# Patient Record
Sex: Male | Born: 1940 | Race: White | Hispanic: No | Marital: Married | Smoking: Never smoker
Health system: Southern US, Community
[De-identification: ages and names within clinical notes are randomized; demographics above are authoritative.]

## PROBLEM LIST (undated history)

## (undated) DIAGNOSIS — F0280 Dementia in other diseases classified elsewhere without behavioral disturbance: Secondary | ICD-10-CM

## (undated) DIAGNOSIS — Z1389 Encounter for screening for other disorder: Secondary | ICD-10-CM

## (undated) DIAGNOSIS — I10 Essential (primary) hypertension: Secondary | ICD-10-CM

## (undated) DIAGNOSIS — Z1211 Encounter for screening for malignant neoplasm of colon: Secondary | ICD-10-CM

## (undated) DIAGNOSIS — Z86010 Personal history of colonic polyps: Secondary | ICD-10-CM

## (undated) DIAGNOSIS — G309 Alzheimer's disease, unspecified: Secondary | ICD-10-CM

## (undated) DIAGNOSIS — M1990 Unspecified osteoarthritis, unspecified site: Secondary | ICD-10-CM

## (undated) DIAGNOSIS — E119 Type 2 diabetes mellitus without complications: Secondary | ICD-10-CM

## (undated) HISTORY — DX: Encounter for screening for other disorder: Z13.89

## (undated) HISTORY — DX: Personal history of colonic polyps: Z86.010

## (undated) HISTORY — DX: Type 2 diabetes mellitus without complications: E11.9

## (undated) HISTORY — DX: Encounter for screening for malignant neoplasm of colon: Z12.11

## (undated) HISTORY — PX: COLONOSCOPY: SHX174

## (undated) HISTORY — DX: Unspecified osteoarthritis, unspecified site: M19.90

## (undated) HISTORY — DX: Essential (primary) hypertension: I10

---

## 1970-03-22 HISTORY — PX: TONSILLECTOMY: SHX5217

## 2005-01-07 ENCOUNTER — Ambulatory Visit: Payer: Self-pay | Admitting: Internal Medicine

## 2007-10-02 ENCOUNTER — Emergency Department: Payer: Self-pay | Admitting: Emergency Medicine

## 2007-10-02 ENCOUNTER — Other Ambulatory Visit: Payer: Self-pay

## 2008-10-24 ENCOUNTER — Ambulatory Visit: Payer: Self-pay | Admitting: Family Medicine

## 2010-01-06 ENCOUNTER — Ambulatory Visit: Payer: Self-pay | Admitting: Family Medicine

## 2010-03-22 DIAGNOSIS — Z86010 Personal history of colonic polyps: Secondary | ICD-10-CM

## 2010-03-22 HISTORY — DX: Personal history of colonic polyps: Z86.010

## 2011-01-29 ENCOUNTER — Ambulatory Visit: Payer: Self-pay | Admitting: General Surgery

## 2012-09-13 ENCOUNTER — Ambulatory Visit: Payer: Self-pay | Admitting: Family Medicine

## 2012-09-13 ENCOUNTER — Emergency Department: Payer: Self-pay | Admitting: Internal Medicine

## 2012-10-04 ENCOUNTER — Encounter: Payer: Self-pay | Admitting: *Deleted

## 2012-10-04 DIAGNOSIS — Z860100 Personal history of colon polyps, unspecified: Secondary | ICD-10-CM | POA: Insufficient documentation

## 2017-02-14 ENCOUNTER — Encounter: Payer: Self-pay | Admitting: Emergency Medicine

## 2017-02-14 ENCOUNTER — Emergency Department: Payer: Medicare HMO

## 2017-02-14 ENCOUNTER — Emergency Department
Admission: EM | Admit: 2017-02-14 | Discharge: 2017-02-14 | Disposition: A | Discharge status: Home / Self Care | Payer: Medicare HMO | Attending: Emergency Medicine | Admitting: Emergency Medicine

## 2017-02-14 DIAGNOSIS — R1032 Left lower quadrant pain: Secondary | ICD-10-CM | POA: Diagnosis not present

## 2017-02-14 DIAGNOSIS — N23 Unspecified renal colic: Secondary | ICD-10-CM | POA: Insufficient documentation

## 2017-02-14 DIAGNOSIS — E860 Dehydration: Secondary | ICD-10-CM | POA: Insufficient documentation

## 2017-02-14 DIAGNOSIS — E1165 Type 2 diabetes mellitus with hyperglycemia: Secondary | ICD-10-CM | POA: Insufficient documentation

## 2017-02-14 DIAGNOSIS — R109 Unspecified abdominal pain: Secondary | ICD-10-CM

## 2017-02-14 DIAGNOSIS — F0280 Dementia in other diseases classified elsewhere without behavioral disturbance: Secondary | ICD-10-CM | POA: Diagnosis not present

## 2017-02-14 DIAGNOSIS — G309 Alzheimer's disease, unspecified: Secondary | ICD-10-CM | POA: Diagnosis not present

## 2017-02-14 DIAGNOSIS — I10 Essential (primary) hypertension: Secondary | ICD-10-CM | POA: Insufficient documentation

## 2017-02-14 DIAGNOSIS — R1110 Vomiting, unspecified: Secondary | ICD-10-CM | POA: Diagnosis present

## 2017-02-14 DIAGNOSIS — R739 Hyperglycemia, unspecified: Secondary | ICD-10-CM

## 2017-02-14 HISTORY — DX: Alzheimer's disease, unspecified: G30.9

## 2017-02-14 HISTORY — DX: Dementia in other diseases classified elsewhere, unspecified severity, without behavioral disturbance, psychotic disturbance, mood disturbance, and anxiety: F02.80

## 2017-02-14 LAB — COMPREHENSIVE METABOLIC PANEL
ALT: 17 U/L (ref 17–63)
ANION GAP: 13 (ref 5–15)
AST: 24 U/L (ref 15–41)
Albumin: 4.1 g/dL (ref 3.5–5.0)
Alkaline Phosphatase: 55 U/L (ref 38–126)
BILIRUBIN TOTAL: 0.8 mg/dL (ref 0.3–1.2)
BUN: 22 mg/dL — AB (ref 6–20)
CO2: 25 mmol/L (ref 22–32)
Calcium: 9.1 mg/dL (ref 8.9–10.3)
Chloride: 98 mmol/L — ABNORMAL LOW (ref 101–111)
Creatinine, Ser: 1.04 mg/dL (ref 0.61–1.24)
Glucose, Bld: 261 mg/dL — ABNORMAL HIGH (ref 65–99)
POTASSIUM: 3.9 mmol/L (ref 3.5–5.1)
Sodium: 136 mmol/L (ref 135–145)
TOTAL PROTEIN: 7 g/dL (ref 6.5–8.1)

## 2017-02-14 LAB — URINALYSIS, COMPLETE (UACMP) WITH MICROSCOPIC
BACTERIA UA: NONE SEEN
BILIRUBIN URINE: NEGATIVE
Glucose, UA: >=500 mg/dL — AB
KETONES UR: 20 mg/dL — AB
LEUKOCYTES UA: NEGATIVE
NITRITE: NEGATIVE
PH: 6 (ref 5.0–8.0)
Protein, ur: 100 mg/dL — AB
Specific Gravity, Urine: 1.036 — ABNORMAL HIGH (ref 1.005–1.030)
WBC, UA: NONE SEEN WBC/hpf (ref 0–5)

## 2017-02-14 LAB — CBC
HEMATOCRIT: 39.4 % — AB (ref 40.0–52.0)
HEMOGLOBIN: 13 g/dL (ref 13.0–18.0)
MCH: 32.9 pg (ref 26.0–34.0)
MCHC: 33 g/dL (ref 32.0–36.0)
MCV: 99.4 fL (ref 80.0–100.0)
Platelets: 204 10*3/uL (ref 150–440)
RBC: 3.97 MIL/uL — AB (ref 4.40–5.90)
RDW: 13.7 % (ref 11.5–14.5)
WBC: 12.6 10*3/uL — AB (ref 3.8–10.6)

## 2017-02-14 LAB — LIPASE, BLOOD: LIPASE: 27 U/L (ref 11–51)

## 2017-02-14 MED ORDER — IOPAMIDOL (ISOVUE-300) INJECTION 61%
100.0000 mL | Freq: Once | INTRAVENOUS | Status: AC | PRN
  Administered 2017-02-14: 100 mL via INTRAVENOUS

## 2017-02-14 MED ORDER — ONDANSETRON HCL 4 MG/2ML IJ SOLN
4.0000 mg | Freq: Once | INTRAMUSCULAR | Status: AC
  Administered 2017-02-14: 4 mg via INTRAVENOUS
  Filled 2017-02-14: qty 2

## 2017-02-14 MED ORDER — ACETAMINOPHEN 325 MG PO TABS: 650.0000 mg | ORAL_TABLET | Freq: Four times a day (QID) | ORAL | 0 refills | Status: AC | PRN

## 2017-02-14 MED ORDER — TAMSULOSIN HCL 0.4 MG PO CAPS: 0.4000 mg | ORAL_CAPSULE | Freq: Every day | ORAL | 0 refills | Status: AC

## 2017-02-14 MED ORDER — ONDANSETRON 4 MG PO TBDP: 4.0000 mg | ORAL_TABLET | Freq: Three times a day (TID) | ORAL | 0 refills | Status: AC | PRN

## 2017-02-14 MED ORDER — SODIUM CHLORIDE 0.9 % IV BOLUS (SEPSIS)
1000.0000 mL | Freq: Once | INTRAVENOUS | Status: AC
  Administered 2017-02-14: 1000 mL via INTRAVENOUS

## 2017-02-14 NOTE — ED Provider Notes (Signed)
[facility] Emergency Department Provider Note  ____________________________________________  Time seen: Approximately 4:53 PM  I have reviewed the triage vital signs and the nursing notes.   HISTORY  Chief Complaint Emesis  Level 5 Caveat: Portions of the History and Physical are unable to be obtained due to patient being a poor historian   HPI David Mccormick is a 76 y.o. male with a history of dementia hypertension and diabetes who is brought to the ED by his family due to 2 episodes of vomiting this morning and sweating. Also reported some left flank pain. Family is worried that he might have her hernia on his left abdomen. Blood sugar is followed his usual baseline pattern recently although it has been more elevated over the past several weeks and had been in the past. He has a automated glucose monitor on the left arm. No falls or trauma. No fever. No constipation.     Past Medical History:  Diagnosis Date  . Alzheimer's dementia   . Arthritis   . Diabetes mellitus without complication (HCC)    since 2008  . Hypertension    since 2008  . Personal history of colonic polyps 2012  . Screening for obesity   . Special screening for malignant neoplasms, colon      Patient Active Problem List   Diagnosis Date Noted  . Personal history of colonic polyps      Past Surgical History:  Procedure Laterality Date  . COLONOSCOPY  1610,9604   20o6 with polyp sigmoid colon 60 cm/30cm colon snare. 2012 by Dr. Lemar Livings  . TONSILLECTOMY  1972     Prior to Admission medications   Medication Sig Start Date End Date Taking? Authorizing Provider  acetaminophen (TYLENOL) 325 MG tablet Take 2 tablets (650 mg total) by mouth every 6 (six) hours as needed. 02/14/17   Sharman Cheek, MD  ondansetron (ZOFRAN ODT) 4 MG disintegrating tablet Take 1 tablet (4 mg total) by mouth every 8 (eight) hours as needed for nausea or vomiting. 02/14/17   Sharman Cheek,  MD  tamsulosin (FLOMAX) 0.4 MG CAPS capsule Take 1 capsule (0.4 mg total) by mouth daily. 02/14/17   Sharman Cheek, MD  Lantus   Allergies Patient has no known allergies.   No family history on file.  Social History Social History   Tobacco Use  . Smoking status: Never Smoker  . Smokeless tobacco: Never Used  Substance Use Topics  . Alcohol use: No  . Drug use: No    Review of Systems  Constitutional:   No fever or chills.  ENT:   No sore throat. Positive rhinorrhea. Cardiovascular:   No chest pain or syncope. Respiratory:   No dyspnea or cough. Gastrointestinal:   Positive as above for abdominal pain and vomiting..  Musculoskeletal:   Negative for focal pain or swelling All other systems reviewed and are negative except as documented above in ROS and HPI.  ____________________________________________   PHYSICAL EXAM:  VITAL SIGNS: ED Triage Vitals  Enc Vitals Group     BP 02/14/17 1432 (!) 156/58     Pulse Rate 02/14/17 1432 (!) 52     Resp 02/14/17 1432 14     Temp 02/14/17 1432 97.6 F (36.4 C)     Temp Source 02/14/17 1432 Oral     SpO2 02/14/17 1432 99 %     Weight 02/14/17 1433 203 lb (92.1 kg)     Height 02/14/17 1433 5\' 9"  (1.753 m)  Head Circumference --      Peak Flow --      Pain Score --      Pain Loc --      Pain Edu? --      Excl. in GC? --     Vital signs reviewed, nursing assessments reviewed.   Constitutional:   Alert and oriented to person and place. Not in distress. Eyes:   No scleral icterus.  EOMI. No nystagmus. No conjunctival pallor. PERRL. ENT   Head:   Normocephalic and atraumatic.   Nose:   No congestion/rhinnorhea.    Mouth/Throat:   Dry mucous membranes, no pharyngeal erythema. No peritonsillar mass.    Neck:   No meningismus. Full ROM. Hematological/Lymphatic/Immunilogical:   No cervical lymphadenopathy. Cardiovascular:   RRR. Symmetric bilateral radial and DP pulses.  No murmurs.  Respiratory:    Normal respiratory effort without tachypnea/retractions. Breath sounds are clear and equal bilaterally. No wheezes/rales/rhonchi. Gastrointestinal:   Soft with mild generalized tenderness, nonfocal. Non distended. There is no CVA tenderness.  No rebound, rigidity, or guarding. No palpable hernia mass Genitourinary:   deferred Musculoskeletal:   Normal range of motion in all extremities. No joint effusions.  No lower extremity tenderness.  No edema. Neurologic:   Restricted but normal speech and language.  Motor grossly intact. No gross focal neurologic deficits are appreciated.  Skin:    Skin is warm, dry and intact. No rash noted.  No petechiae, purpura, or bullae.  ____________________________________________    LABS (pertinent positives/negatives) (all labs ordered are listed, but only abnormal results are displayed) Labs Reviewed  COMPREHENSIVE METABOLIC PANEL - Abnormal; Notable for the following components:      Result Value   Chloride 98 (*)    Glucose, Bld 261 (*)    BUN 22 (*)    All other components within normal limits  CBC - Abnormal; Notable for the following components:   WBC 12.6 (*)    RBC 3.97 (*)    HCT 39.4 (*)    All other components within normal limits  URINALYSIS, COMPLETE (UACMP) WITH MICROSCOPIC - Abnormal; Notable for the following components:   Color, Urine RED (*)    APPearance CLOUDY (*)    Specific Gravity, Urine 1.036 (*)    Glucose, UA >=500 (*)    Hgb urine dipstick LARGE (*)    Ketones, ur 20 (*)    Protein, ur 100 (*)    Squamous Epithelial / LPF 0-5 (*)    All other components within normal limits  LIPASE, BLOOD   ____________________________________________   EKG    ____________________________________________    RADIOLOGY  Ct Abdomen Pelvis W Contrast  Result Date: 02/14/2017 CLINICAL DATA:  Per nurse note due to patient condition: Pt comes into the Ed via POV c/o emesis, cold sweats, high sugar, and possible inguinal hernia  on the left. Patient states he feels fine now and per the family he is at his baseline. EXAM: CT ABDOMEN AND PELVIS WITH CONTRAST TECHNIQUE: Multidetector CT imaging of the abdomen and pelvis was performed using the standard protocol following bolus administration of intravenous contrast. CONTRAST:  100mL ISOVUE-300 IOPAMIDOL (ISOVUE-300) INJECTION 61% COMPARISON:  None. FINDINGS: Lower chest: There is atelectasis/ scarring at the lung bases. There is extensive atherosclerotic calcification of the coronary vessels. No pericardial effusion. Hepatobiliary: The liver is homogeneous. There is a calcified granuloma along the inferior aspect of the right hepatic lobe. No suspicious liver lesions are identified. Calcified gallstone or gallstones layer within  the gallbladder. Pancreas: Unremarkable. No pancreatic ductal dilatation or surrounding inflammatory changes. Spleen: Normal in size without focal abnormality. Adrenals/Urinary Tract: The adrenal glands are normal. There is hydronephrosis and perinephric stranding on the left side secondary to a calculus at the ureteropelvic junction which 3 mm. There is delayed enhancement and excretion on the left secondary to obstruction. The right kidney and ureter are unremarkable in appearance. The urinary bladder is unremarkable. Stomach/Bowel: Small hiatal hernia is present. The stomach and small bowel loops are normal in appearance. The appendix is well seen and has a normal appearance. Loops of colon are normal in appearance. Vascular/Lymphatic: There is atherosclerotic calcification of the abdominal aorta not associated with aneurysm. Although involved by atherosclerosis, there is vascular opacification of the celiac axis, superior mesenteric artery, and inferior mesenteric artery. Normal appearance of the portal venous system and inferior vena cava. No retroperitoneal or mesenteric adenopathy. Reproductive: Scattered prostatic calcifications are present. Prostate is mildly  enlarged. The seminal vesicles are normal in appearance. Other: Small amount of fat is identified within the inguinal rings bilaterally. No evidence for AS anterior abdominal wall hernia. Normal appearance of inguinal lymph nodes bilaterally. No evidence for inguinal mass as described in the history. Musculoskeletal: Convex right scoliosis of the lumbar spine, centered at L3-4 and associated with significant degenerative changes. IMPRESSION: 1. Obstructing left ureteral pelvic junction calculus measuring 3 mm. 2. No evidence for inguinal hernia or other inguinal mass. 3. Normal appendix. 4. Coronary artery disease. 5.  Aortic atherosclerosis.  (ICD10-I70.0) 6. Cholelithiasis. 7. Small hiatal hernia. 8. Prostatic enlargement. 9. Scoliosis and degenerative changes of the lumbar spine. Electronically Signed   By: Norva PavlovElizabeth  Brown M.D.   On: 02/14/2017 16:41    ____________________________________________   PROCEDURES Procedures  ____________________________________________   DIFFERENTIAL DIAGNOSIS  Bowel obstruction, perforation, diverticulitis, AAA, viral syndrome, kidney stone  CLINICAL IMPRESSION / ASSESSMENT AND PLAN / ED COURSE  Pertinent labs & imaging results that were available during my care of the patient were reviewed by me and considered in my medical decision making (see chart for details).     Clinical Course as of Feb 15 1920  [facility]  1543 Gen abp pain/tenderness. ?llq focality. Poor historian. Will get CT due to age, DM, lack of precise history  [PS]  1653 CT reveals obstructing 3mm stone on L UPJ. No appy or diverticulitis.  [PS]    Clinical Course User Index [PS] Sharman CheekStafford, Tameah Mihalko, MD     ----------------------------------------- 7:21 PM on 02/14/2017 -----------------------------------------  Symptoms controlled, vitals stable. Tolerating oral intake. Eager to go home. Counseled on kidney stone and dehydration. Zofran Tylenol and Flomax. Follow up with  primary care. Return precautions given.  ____________________________________________   FINAL CLINICAL IMPRESSION(S) / ED DIAGNOSES    Final diagnoses:  Ureteral colic  Left flank pain  Dehydration  Hyperglycemia      This SmartLink is deprecated. Use AVSMEDLIST instead to display the medication list for a patient.   Portions of this note were generated with dragon dictation software. Dictation errors may occur despite best attempts at proofreading.    Sharman CheekStafford, Kona Lover, MD 02/14/17 1921

## 2017-02-14 NOTE — ED Triage Notes (Signed)
Pt comes into the Ed via POV c/o emesis, cold sweats, high sugar, and possible inguinal hernia on the left.  Patient states he feels fine now and per the family he is at his baseline.  Patient tried to get a hold of PCP but was unable to.  Patient concerned about the bulge that was seen in the left lower quadrant. Denies any chest pain, shortness of breath, or dizziness.

## 2017-02-14 NOTE — Discharge Instructions (Signed)
Your CT scan shows a small (3mm) kidney stone outside of the left kidney.  This appears to be the cause of your pain.  It may also be causing decreased appetite and nausea.  Take Zofran as needed, along with tylenol to control symptoms. Ensure you are drinking lots of water to stay hydrated and help pass the stone. Follow up with your doctor this week for continued monitoring of your symptoms. Return to the ER immediately if you have severe pain, vomiting, or fever.

## 2017-02-14 NOTE — ED Notes (Signed)
Pt d/c with his spouse. Spouse verbalized understanding of d/c instructions and prescriptions.

## 2017-03-07 ENCOUNTER — Encounter: Payer: Self-pay | Admitting: Emergency Medicine

## 2017-03-07 ENCOUNTER — Emergency Department
Admission: EM | Admit: 2017-03-07 | Discharge: 2017-03-08 | Disposition: A | Discharge status: Home / Self Care | Payer: Medicare HMO | Attending: Emergency Medicine | Admitting: Emergency Medicine

## 2017-03-07 ENCOUNTER — Emergency Department: Payer: Medicare HMO

## 2017-03-07 DIAGNOSIS — R531 Weakness: Secondary | ICD-10-CM | POA: Diagnosis not present

## 2017-03-07 DIAGNOSIS — I10 Essential (primary) hypertension: Secondary | ICD-10-CM | POA: Diagnosis not present

## 2017-03-07 DIAGNOSIS — E119 Type 2 diabetes mellitus without complications: Secondary | ICD-10-CM | POA: Insufficient documentation

## 2017-03-07 DIAGNOSIS — W19XXXA Unspecified fall, initial encounter: Secondary | ICD-10-CM | POA: Diagnosis not present

## 2017-03-07 DIAGNOSIS — G309 Alzheimer's disease, unspecified: Secondary | ICD-10-CM | POA: Diagnosis not present

## 2017-03-07 LAB — CBC
HCT: 38.7 % — ABNORMAL LOW (ref 40.0–52.0)
Hemoglobin: 12.9 g/dL — ABNORMAL LOW (ref 13.0–18.0)
MCH: 33.4 pg (ref 26.0–34.0)
MCHC: 33.3 g/dL (ref 32.0–36.0)
MCV: 100.5 fL — AB (ref 80.0–100.0)
PLATELETS: 207 10*3/uL (ref 150–440)
RBC: 3.85 MIL/uL — AB (ref 4.40–5.90)
RDW: 14 % (ref 11.5–14.5)
WBC: 9.8 10*3/uL (ref 3.8–10.6)

## 2017-03-07 LAB — COMPREHENSIVE METABOLIC PANEL
ALK PHOS: 57 U/L (ref 38–126)
ALT: 21 U/L (ref 17–63)
ANION GAP: 12 (ref 5–15)
AST: 46 U/L — ABNORMAL HIGH (ref 15–41)
Albumin: 3.9 g/dL (ref 3.5–5.0)
BUN: 26 mg/dL — ABNORMAL HIGH (ref 6–20)
CALCIUM: 9.1 mg/dL (ref 8.9–10.3)
CHLORIDE: 104 mmol/L (ref 101–111)
CO2: 22 mmol/L (ref 22–32)
CREATININE: 1.22 mg/dL (ref 0.61–1.24)
GFR, EST NON AFRICAN AMERICAN: 56 mL/min — AB (ref >60)
Glucose, Bld: 100 mg/dL — ABNORMAL HIGH (ref 65–99)
Potassium: 3.5 mmol/L (ref 3.5–5.1)
SODIUM: 138 mmol/L (ref 135–145)
Total Bilirubin: 0.6 mg/dL (ref 0.3–1.2)
Total Protein: 6.7 g/dL (ref 6.5–8.1)

## 2017-03-07 LAB — TROPONIN I

## 2017-03-07 MED ORDER — SODIUM CHLORIDE 0.9 % IV BOLUS (SEPSIS)
500.0000 mL | Freq: Once | INTRAVENOUS | Status: AC
  Administered 2017-03-07: 500 mL via INTRAVENOUS

## 2017-03-07 MED ORDER — LORAZEPAM 2 MG/ML IJ SOLN
INTRAMUSCULAR | Status: AC
  Filled 2017-03-07: qty 1

## 2017-03-07 MED ORDER — LORAZEPAM 2 MG/ML IJ SOLN
1.0000 mg | Freq: Once | INTRAMUSCULAR | Status: AC
  Administered 2017-03-07: 1 mg via INTRAVENOUS

## 2017-03-07 MED ORDER — LAMOTRIGINE 25 MG PO TABS
75.0000 mg | ORAL_TABLET | Freq: Once | ORAL | Status: AC
  Administered 2017-03-07: 75 mg via ORAL
  Filled 2017-03-07: qty 3

## 2017-03-07 NOTE — ED Notes (Signed)
Sherilyn CooterHenry RN and Onalee Huaavid ED Tech helped the Pt to the side of the bed in order to try to get a urin sample. Pt was not able to understand that he needs to give a urin sample do to his advanced dementia. Pt was placed back in his bed.

## 2017-03-07 NOTE — ED Triage Notes (Signed)
Pt arrived to the ED from home for multiple falls during the day. EMS reports that the Pt is a dementia Pt and that he has been falling all trough the day per family. Pt is AOx2 in no apparent distress with no obvious neurological deficiencies.

## 2017-03-07 NOTE — ED Notes (Signed)
Pt returned from CT °

## 2017-03-07 NOTE — ED Notes (Signed)
Pt went to CT

## 2017-03-07 NOTE — ED Provider Notes (Signed)
[facility] Emergency Department Provider Note ____________________________________________   First MD Initiated Contact with Patient 03/07/17 1956     (approximate)  I have reviewed the triage vital signs and the nursing notes.   HISTORY  Chief Complaint Fall  History of present illness severely limited due to dementia.  HPI David Mccormick is a 76 y.o. male who presents with multiple falls over the course today, unknown mechanism, and not associated with any specific symptoms or visible injuries.  Patient himself is unable to voice any complaint.  Past Medical History:  Diagnosis Date  . Alzheimer's dementia   . Arthritis   . Diabetes mellitus without complication (HCC)    since 2008  . Hypertension    since 2008  . Personal history of colonic polyps 2012  . Screening for obesity   . Special screening for malignant neoplasms, colon     Patient Active Problem List   Diagnosis Date Noted  . Personal history of colonic polyps     Past Surgical History:  Procedure Laterality Date  . COLONOSCOPY  1610,9604   20o6 with polyp sigmoid colon 60 cm/30cm colon snare. 2012 by Dr. Lemar Livings  . TONSILLECTOMY  1972    Prior to Admission medications   Medication Sig Start Date End Date Taking? Authorizing Provider  acetaminophen (TYLENOL) 325 MG tablet Take 2 tablets (650 mg total) by mouth every 6 (six) hours as needed. 02/14/17   Sharman Cheek, MD  ondansetron (ZOFRAN ODT) 4 MG disintegrating tablet Take 1 tablet (4 mg total) by mouth every 8 (eight) hours as needed for nausea or vomiting. 02/14/17   Sharman Cheek, MD  tamsulosin (FLOMAX) 0.4 MG CAPS capsule Take 1 capsule (0.4 mg total) by mouth daily. 02/14/17   Sharman Cheek, MD    Allergies Patient has no known allergies.  History reviewed. No pertinent family history.  Social History Social History   Tobacco Use  . Smoking status: Never Smoker  . Smokeless tobacco: Never  Used  Substance Use Topics  . Alcohol use: No  . Drug use: No    Review of Systems Level V caveat: Unable to obtain review of systems due to dementia    ____________________________________________   PHYSICAL EXAM:  VITAL SIGNS: ED Triage Vitals  Enc Vitals Group     BP 03/07/17 1936 (!) 119/57     Pulse Rate 03/07/17 1936 72     Resp 03/07/17 1936 14     Temp 03/07/17 1936 97.7 F (36.5 C)     Temp Source 03/07/17 1936 Oral     SpO2 03/07/17 1936 100 %     Weight 03/07/17 1937 171 lb 15.3 oz (78 kg)     Height 03/07/17 1937 5\' 9"  (1.753 m)     Head Circumference --      Peak Flow --      Pain Score 03/07/17 1936 0     Pain Loc --      Pain Edu? --      Excl. in GC? --     Constitutional: Alert, oriented x2. Eyes: Conjunctivae are normal.  EOMI.  PERRLA. Head: Atraumatic. Nose: No congestion/rhinnorhea. Mouth/Throat: Mucous membranes are slightly dry.   Neck: Normal range of motion.  Cervical spine nontender. Cardiovascular: Normal rate, regular rhythm. Grossly normal heart sounds.  Good peripheral circulation. Respiratory: Normal respiratory effort.  No retractions. Lungs CTAB. Gastrointestinal: Soft and nontender. No distention.  Genitourinary: No CVA tenderness. Musculoskeletal: No lower extremity edema.  Extremities  warm and well perfused.  Neurologic:  Normal speech and language.  Motor intact in all extremities.  No gross focal neurologic deficits are appreciated.  Skin:  Skin is warm and dry. No rash noted. Psychiatric: Speech and behavior are normal.  ____________________________________________   LABS (all labs ordered are listed, but only abnormal results are displayed)  Labs Reviewed  CBC - Abnormal; Notable for the following components:      Result Value   RBC 3.85 (*)    Hemoglobin 12.9 (*)    HCT 38.7 (*)    MCV 100.5 (*)    All other components within normal limits  COMPREHENSIVE METABOLIC PANEL - Abnormal; Notable for the following  components:   Glucose, Bld 100 (*)    BUN 26 (*)    AST 46 (*)    GFR calc non Af Amer 56 (*)    All other components within normal limits  TROPONIN I  URINALYSIS, COMPLETE (UACMP) WITH MICROSCOPIC   ____________________________________________  EKG  ED ECG REPORT I, Dionne BucySebastian Izaan Kingbird, the attending physician, personally viewed and interpreted this ECG.  Date: 03/07/2017 EKG Time: 1933 Rate: 72 Rhythm: Atrial fibrillation QRS Axis: normal Intervals: LBBB ST/T Wave abnormalities: normal Narrative Interpretation: no evidence of acute ischemia; no significant change when compared to EKG of 09/13/2012 (at that time patient had mildly increased QRS which is now into LBBB range but morphology is identical)  ____________________________________________  RADIOLOGY  CT head: No ICH or other acute injuries CT cspine: No fracture  ____________________________________________   PROCEDURES  Procedure(s) performed: No    Critical Care performed: No ____________________________________________   INITIAL IMPRESSION / ASSESSMENT AND PLAN / ED COURSE  Pertinent labs & imaging results that were available during my care of the patient were reviewed by me and considered in my medical decision making (see chart for details).  76 year old male with past medical history as noted above presents after apparent multiple falls throughout the day.  Per EMS, patient was hypotensive on scene however blood pressure returned to normal with fluid bolus.  Patient himself is demented and is unable to provide any history.  Past medical records reviewed in Epic.  Patient was seen in the ED on 1126 with vomiting and flank pain.  He was discharged with tamsulosin for a ureteral stone.  On exam, vital signs are normal, the patient is comfortable appearing, and there is no visible trauma.  He is at baseline mental status, and the neuro exam is nonfocal.  The source of the falls is unclear, however I have  a strong suspicion that it may be related to orthostatics from being on the tamsulosin.  Differential also includes dehydration, UTI, other metabolic cause, or less likely cardiac.  Will obtain CT head and C-spine to rule out occult trauma, obtain basic labs, cardiac enzymes, and UA, and reassess.  If patient remains stable and the workup is negative, then likely cause is orthostatics and we will discontinue the tamsulosin.    ----------------------------------------- 11:31 PM on 03/07/2017 -----------------------------------------  Patient had an episode of agitation/sundowning.  After 1 mg of Ativan, he is now calm.  Still pending urinalysis.  If the UA is negative, and vital signs remain stable then plan for discharge home with instruction to discontinue the tamsulosin.  If patient has recurrent hypotension, or signs of UTI, may require admission.  Patient signed out to the oncoming physician Dr. Zenda AlpersWebster.  ____________________________________________   FINAL CLINICAL IMPRESSION(S) / ED DIAGNOSES  Final diagnoses:  Generalized weakness  NEW MEDICATIONS STARTED DURING THIS VISIT:  This SmartLink is deprecated. Use AVSMEDLIST instead to display the medication list for a patient.   Note:  This document was prepared using Dragon voice recognition software and may include unintentional dictation errors.    Dionne BucySiadecki, Derris Millan, MD 03/07/17 [phone]

## 2017-03-07 NOTE — ED Notes (Signed)
Pt became agitated and the MD ordered medication. Medication was given and the Pt placed on the bed with the help of Raquel RN and Maralyn SagoSarah, ED Tech.

## 2017-03-07 NOTE — ED Notes (Signed)
Sherilyn CooterHenry RN called the lab to get the results of the troponin that was sent about 2hr ago.

## 2017-03-08 NOTE — ED Notes (Signed)
Pt's spouse states that she wants to take the Pt home without having a urine test done. Dr. Zenda AlpersWebster was notified and states that she will talk to the Pt to explain the risks.

## 2017-03-08 NOTE — ED Notes (Signed)
Dr. Zenda AlpersWebster talked to the Pt's family. Pt's family requested that the Pt went home via EMS since the Pt's spouse is unable to help the Pt into or out of the car.

## 2017-03-08 NOTE — Discharge Instructions (Signed)
Please follow up with your primary care physician.

## 2017-03-08 NOTE — ED Provider Notes (Signed)
-----------------------------------------   12:46 AM on 03/08/2017 -----------------------------------------   Blood pressure 128/71, pulse 68, temperature 97.7 F (36.5 C), temperature source Oral, resp. rate 16, height 5\' 9"  (1.753 m), weight 78 kg (171 lb 15.3 oz), SpO2 97 %.  Assuming care from Dr. Marisa SeverinSiadecki.  In short, David Mccormick is a 76 y.o. male with a chief complaint of Fall .  Refer to the original H&P for additional details.  The current plan of care is to follow up the results of the urinalysis.  After waiting some time in the patient being unable to provide a urinalysis the patient's wife states that she wants to just take him home.  The patient is very combative when we try to provide an in and out and we do not want to take urine forcefully.  She also states that the patient does not understand that he needs to urinate and we will not do it at this time.  The patient will be discharged to follow-up with his primary care physician.        Rebecka ApleyWebster, Hogle P, MD 03/08/17 [id number]

## 2017-03-18 ENCOUNTER — Emergency Department: Payer: Medicare HMO

## 2017-03-18 ENCOUNTER — Encounter: Payer: Self-pay | Admitting: Emergency Medicine

## 2017-03-18 ENCOUNTER — Emergency Department
Admission: EM | Admit: 2017-03-18 | Discharge: 2017-03-18 | Disposition: A | Discharge status: Home / Self Care | Payer: Medicare HMO | Attending: Emergency Medicine | Admitting: Emergency Medicine

## 2017-03-18 DIAGNOSIS — I10 Essential (primary) hypertension: Secondary | ICD-10-CM | POA: Insufficient documentation

## 2017-03-18 DIAGNOSIS — Z79899 Other long term (current) drug therapy: Secondary | ICD-10-CM | POA: Diagnosis not present

## 2017-03-18 DIAGNOSIS — F0390 Unspecified dementia without behavioral disturbance: Secondary | ICD-10-CM | POA: Insufficient documentation

## 2017-03-18 DIAGNOSIS — K5900 Constipation, unspecified: Secondary | ICD-10-CM | POA: Insufficient documentation

## 2017-03-18 DIAGNOSIS — R4182 Altered mental status, unspecified: Secondary | ICD-10-CM | POA: Diagnosis present

## 2017-03-18 DIAGNOSIS — E119 Type 2 diabetes mellitus without complications: Secondary | ICD-10-CM | POA: Insufficient documentation

## 2017-03-18 LAB — COMPREHENSIVE METABOLIC PANEL
ALT: 29 U/L (ref 17–63)
AST: 53 U/L — AB (ref 15–41)
Albumin: 4.4 g/dL (ref 3.5–5.0)
Alkaline Phosphatase: 64 U/L (ref 38–126)
Anion gap: 11 (ref 5–15)
BUN: 20 mg/dL (ref 6–20)
CHLORIDE: 101 mmol/L (ref 101–111)
CO2: 23 mmol/L (ref 22–32)
Calcium: 9 mg/dL (ref 8.9–10.3)
Creatinine, Ser: 1.16 mg/dL (ref 0.61–1.24)
GFR calc Af Amer: >60 mL/min (ref >60)
GFR, EST NON AFRICAN AMERICAN: 59 mL/min — AB (ref >60)
Glucose, Bld: 364 mg/dL — ABNORMAL HIGH (ref 65–99)
POTASSIUM: 3.9 mmol/L (ref 3.5–5.1)
SODIUM: 135 mmol/L (ref 135–145)
Total Bilirubin: 1.1 mg/dL (ref 0.3–1.2)
Total Protein: 7.1 g/dL (ref 6.5–8.1)

## 2017-03-18 LAB — DIFFERENTIAL
BASOS ABS: 0 10*3/uL (ref 0–0.1)
BASOS PCT: 1 %
EOS ABS: 0 10*3/uL (ref 0–0.7)
Eosinophils Relative: 0 %
Lymphocytes Relative: 14 %
Lymphs Abs: 1 10*3/uL (ref 1.0–3.6)
MONO ABS: 0.3 10*3/uL (ref 0.2–1.0)
MONOS PCT: 4 %
Neutro Abs: 5.8 10*3/uL (ref 1.4–6.5)
Neutrophils Relative %: 81 %

## 2017-03-18 LAB — AMMONIA: AMMONIA: 22 umol/L (ref 9–35)

## 2017-03-18 LAB — CBC
HEMATOCRIT: 40.6 % (ref 40.0–52.0)
Hemoglobin: 13.4 g/dL (ref 13.0–18.0)
MCH: 33 pg (ref 26.0–34.0)
MCHC: 33.1 g/dL (ref 32.0–36.0)
MCV: 99.9 fL (ref 80.0–100.0)
Platelets: 195 10*3/uL (ref 150–440)
RBC: 4.06 MIL/uL — AB (ref 4.40–5.90)
RDW: 14.2 % (ref 11.5–14.5)
WBC: 7.3 10*3/uL (ref 3.8–10.6)

## 2017-03-18 LAB — BRAIN NATRIURETIC PEPTIDE: B NATRIURETIC PEPTIDE 5: 68 pg/mL (ref 0.0–100.0)

## 2017-03-18 LAB — TROPONIN I

## 2017-03-18 MED ORDER — HALOPERIDOL LACTATE 5 MG/ML IJ SOLN
2.0000 mg | Freq: Once | INTRAMUSCULAR | Status: AC
  Administered 2017-03-18: 2 mg via INTRAVENOUS
  Filled 2017-03-18: qty 1

## 2017-03-18 MED ORDER — IOPAMIDOL (ISOVUE-300) INJECTION 61%: 30.0000 mL | Freq: Once | INTRAVENOUS | Status: DC | PRN

## 2017-03-18 MED ORDER — POLYETHYLENE GLYCOL 3350 17 GM/SCOOP PO POWD: 17.0000 g | Freq: Two times a day (BID) | ORAL | 0 refills | Status: AC

## 2017-03-18 NOTE — ED Notes (Signed)
Family notified of safety precautions, fall precautions, and use of urinal. Son expresses understanding.

## 2017-03-18 NOTE — ED Notes (Signed)
Glucose 266 per patient device

## 2017-03-18 NOTE — ED Provider Notes (Signed)
[facility] Emergency Department Provider Note   ____________________________________________   First MD Initiated Contact with Patient 03/18/17 1405     (approximate)  I have reviewed the triage vital signs and the nursing notes.   HISTORY  Chief Complaint Altered Mental Status  history Limited by altered mental status  HPI David Mccormick is a 76 y.o. male Who comes in with a history of constipation for 5 days. Family told EMS that he is confused beyond his baseline dementia . 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 His breath smells funny is not an acetone smell and it is not really fetor hepaticus but it is it's funny chemical smell    Past Medical History:  Diagnosis Date  . Alzheimer's dementia   . Arthritis   . Diabetes mellitus without complication (HCC)    since 2008  . Hypertension    since 2008  . Personal history of colonic polyps 2012  . Screening for obesity   . Special screening for malignant neoplasms, colon     Patient Active Problem List   Diagnosis Date Noted  . Personal history of colonic polyps     Past Surgical History:  Procedure Laterality Date  . COLONOSCOPY  4098,[id number],2012   20o6 with polyp sigmoid colon 60 cm/30cm colon snare. 2012 by Dr. Lemar LivingsByrnett  . TONSILLECTOMY  1972    Prior to Admission medications   Medication Sig Start Date End Date Taking? Authorizing Provider  acetaminophen (TYLENOL) 325 MG tablet Take 2 tablets (650 mg total) by mouth every 6 (six) hours as needed. 02/14/17   Sharman CheekStafford, Phillip, MD  ondansetron (ZOFRAN ODT) 4 MG disintegrating tablet Take 1 tablet (4 mg total) by mouth every 8 (eight) hours as needed for nausea or vomiting. 02/14/17   Sharman CheekStafford, Phillip, MD  tamsulosin (FLOMAX) 0.4 MG CAPS capsule Take 1 capsule (0.4 mg total) by mouth  daily. 02/14/17   Sharman CheekStafford, Phillip, MD    Allergies Patient has no known allergies.  History reviewed. No pertinent family history.  Social History Social History   Tobacco Use  . Smoking status: Never Smoker  . Smokeless tobacco: Never Used  Substance Use Topics  . Alcohol use: No  . Drug use: No    Review of Systems  Constitutional: No fever/chills Eyes: No visual changes. ENT: No sore throat. Cardiovascular: Denies chest pain. Respiratory: Denies shortness of breath. Gastrointestinal: No abdominal pain.  No nausea, no vomiting.  No diarrhea.  No constipation. Genitourinary: Negative for dysuria. Musculoskeletal: Negative for back pain. Skin: Negative for rash. Neurological: Negative for headaches, focal weakness  ____________________________________________   PHYSICAL EXAM:  VITAL SIGNS: ED Triage Vitals  Enc Vitals Group     BP      Pulse      Resp      Temp      Temp src      SpO2      Weight      Height      Head Circumference      Peak Flow      Pain Score      Pain Loc      Pain Edu?      Excl. in GC?   } Constitutional: Alert and oriented. Well appearing and in no acute distress. Eyes: Conjunctivae are normal. PERRL.  EOMI. Head: Atraumatic. Nose: No congestion/rhinnorhea. Mouth/Throat: Mucous membranes are moist.  Oropharynx non-erythematous. Neck: No stridor. Cardiovascular: Normal rate, regular rhythm. Grossly normal heart sounds.  Good peripheral circulation. Respiratory: Normal respiratory effort.  No retractions. Lungs CTAB. Gastrointestinal: Soft but as noted in history of present illness he flinches when I palpate his right upper quadrant and then moves my hand away. No distention. No abdominal bruits. No CVA tenderness. Musculoskeletal: No lower extremity tenderness 1+ bilateral edema.  No joint effusions. Neurologic:  Normal speech and language. No gross focal neurologic deficits are appreciated.  Skin:  Skin is warm, dry and intact.  No rash noted.   ____________________________________________   LABS (all labs ordered are listed, but only abnormal results are displayed)  Labs Reviewed  COMPREHENSIVE METABOLIC PANEL  CBC  TROPONIN I  DIFFERENTIAL  BRAIN NATRIURETIC PEPTIDE  URINALYSIS, COMPLETE (UACMP) WITH MICROSCOPIC  AMMONIA  CBG MONITORING, ED   ____________________________________________  EKG  EKG read and interpreted by me shows normal sinus rhythm rate of 95 left axis nonspecific interventricular conduction delay he has decreased R-wave progression compared to EKG from December 17 but otherwise EKG looks similar allowing for changes in the baseline which is very wavy on the present EKG____________________________________________  RADIOLOGY  No results found.  ____________________________________________   PROCEDURES  Procedure(s) performed:   Procedures  Critical Care performed:   ____________________________________________   INITIAL IMPRESSION / ASSESSMENT AND PLAN / ED COURSE  ----------------------------------------- 2:57 PM on 03/18/2017 -----------------------------------------  Speaking with the patient's daughter he is more confusing usual in fact right now he is walking around in the room with his clothes off. She reports he didn't as far as she knows have a cough earlier just started acting differently. As he will not lay still or sitting still we'll give him 2 of Haldol slow IV push.   pt signed out   ____________________________________________   FINAL CLINICAL IMPRESSION(S) / ED DIAGNOSES  Final diagnoses:  None     ED Discharge Orders    None       Note:  This document was prepared using Dragon voice recognition software and may include unintentional dictation errors.    Arnaldo NatalMalinda, Paul F, MD 03/18/17 [phone]

## 2017-03-18 NOTE — ED Notes (Signed)
Charge Animal nutritionistN Brandy notified of need for Recruitment consultantsafety sitter. Fall precautions in place. Mellody DanceKeith EMT at bedside as sitter

## 2017-03-18 NOTE — ED Triage Notes (Signed)
[facility] with initial complaint of "Constipation for 5 days". However, family also states patient is confused beyond his baseline dementia

## 2017-03-18 NOTE — ED Notes (Signed)
Son verbalizes understanding of d/c paperwork and follow up. Son denies any further concerns for this visit. This RN and tech tried to get pt in wheelchair, pt refuses to sit down. Son states " please give us a minute I'm better with him alone." RN outside of door, son comes out and states " we just need to be alone for a minute, he does better with me." RN informed family that pt safety is a concern , son states " I've been doing this for years." Tech at bedside

## 2017-03-18 NOTE — ED Provider Notes (Signed)
-----------------------------------------   5:14 PM on 03/18/2017 -----------------------------------------  Family is here refusing head CT and abdominal CT at this time.  They state the patient has had multiple CT scans in the past they never show anything.  They state the patient has had a significant decline as far as his dementia goes over the past 8 months to 1 year.  Son states the patient prefers to be naked at baseline and will often times take off is clothes.  His medical workup has been largely nonrevealing so far.  They do not wish to wait for urinalysis.  They wish to take the patient back home.  Medical workup is normal, besides urinalysis which we have yet to collect but they would prefer not to wait any longer.  Son does state constipation.  X-ray consistent with moderate stool.  We will place on MiraLAX.  Chest x-ray negative.   Minna AntisPaduchowski, David Witham, MD 03/18/17 [phone]

## 2017-03-18 NOTE — ED Notes (Signed)
Family at bedside. 

## 2017-03-18 NOTE — ED Notes (Signed)
Patient found walking around the room nude, disconnected from the monitors while the Son was standing by his side. Patient reoriented, family reeducated on safety precautions.

## 2017-03-18 NOTE — ED Notes (Signed)
David HesselbachMaria NT, Safety Sitter at bedside

## 2017-03-22 ENCOUNTER — Emergency Department: Payer: Medicare HMO

## 2017-03-22 ENCOUNTER — Emergency Department
Admission: EM | Admit: 2017-03-22 | Discharge: 2017-03-24 | Disposition: A | Discharge status: Other Acute Inpatient Hospital | Payer: Medicare HMO | Attending: Emergency Medicine | Admitting: Emergency Medicine

## 2017-03-22 ENCOUNTER — Encounter: Payer: Self-pay | Admitting: Emergency Medicine

## 2017-03-22 DIAGNOSIS — F331 Major depressive disorder, recurrent, moderate: Secondary | ICD-10-CM | POA: Insufficient documentation

## 2017-03-22 DIAGNOSIS — F0281 Dementia in other diseases classified elsewhere with behavioral disturbance: Secondary | ICD-10-CM

## 2017-03-22 DIAGNOSIS — F329 Major depressive disorder, single episode, unspecified: Secondary | ICD-10-CM | POA: Diagnosis present

## 2017-03-22 DIAGNOSIS — G309 Alzheimer's disease, unspecified: Secondary | ICD-10-CM

## 2017-03-22 DIAGNOSIS — F17210 Nicotine dependence, cigarettes, uncomplicated: Secondary | ICD-10-CM | POA: Diagnosis not present

## 2017-03-22 LAB — CBC WITH DIFFERENTIAL/PLATELET
Basophils Absolute: 0 10*3/uL (ref 0–0.1)
Basophils Relative: 1 %
EOS ABS: 0.1 10*3/uL (ref 0–0.7)
Eosinophils Relative: 1 %
HCT: 37.3 % — ABNORMAL LOW (ref 40.0–52.0)
HEMOGLOBIN: 12.5 g/dL — AB (ref 13.0–18.0)
LYMPHS ABS: 1 10*3/uL (ref 1.0–3.6)
Lymphocytes Relative: 16 %
MCH: 33.4 pg (ref 26.0–34.0)
MCHC: 33.5 g/dL (ref 32.0–36.0)
MCV: 99.8 fL (ref 80.0–100.0)
Monocytes Absolute: 0.3 10*3/uL (ref 0.2–1.0)
Monocytes Relative: 6 %
NEUTROS PCT: 76 %
Neutro Abs: 4.6 10*3/uL (ref 1.4–6.5)
Platelets: 166 10*3/uL (ref 150–440)
RBC: 3.74 MIL/uL — AB (ref 4.40–5.90)
RDW: 13.7 % (ref 11.5–14.5)
WBC: 6 10*3/uL (ref 3.8–10.6)

## 2017-03-22 LAB — URINALYSIS, ROUTINE W REFLEX MICROSCOPIC
BILIRUBIN URINE: NEGATIVE
Bacteria, UA: NONE SEEN
Glucose, UA: >=500 mg/dL — AB
Ketones, ur: 20 mg/dL — AB
Leukocytes, UA: NEGATIVE
NITRITE: NEGATIVE
Protein, ur: NEGATIVE mg/dL
SPECIFIC GRAVITY, URINE: 1.028 (ref 1.005–1.030)
pH: 5 (ref 5.0–8.0)

## 2017-03-22 LAB — COMPREHENSIVE METABOLIC PANEL
ALBUMIN: 4 g/dL (ref 3.5–5.0)
ALT: 21 U/L (ref 17–63)
ANION GAP: 10 (ref 5–15)
AST: 35 U/L (ref 15–41)
Alkaline Phosphatase: 57 U/L (ref 38–126)
BUN: 24 mg/dL — ABNORMAL HIGH (ref 6–20)
CHLORIDE: 100 mmol/L — AB (ref 101–111)
CO2: 25 mmol/L (ref 22–32)
Calcium: 8.8 mg/dL — ABNORMAL LOW (ref 8.9–10.3)
Creatinine, Ser: 1.09 mg/dL (ref 0.61–1.24)
GFR calc Af Amer: >60 mL/min (ref >60)
GFR calc non Af Amer: >60 mL/min (ref >60)
GLUCOSE: 425 mg/dL — AB (ref 65–99)
POTASSIUM: 4.2 mmol/L (ref 3.5–5.1)
SODIUM: 135 mmol/L (ref 135–145)
TOTAL PROTEIN: 6.4 g/dL — AB (ref 6.5–8.1)
Total Bilirubin: 1 mg/dL (ref 0.3–1.2)

## 2017-03-22 LAB — PROTIME-INR
INR: 1.02
Prothrombin Time: 13.3 seconds (ref 11.4–15.2)

## 2017-03-22 LAB — TROPONIN I: Troponin I: <0.03 ng/mL (ref <0.03)

## 2017-03-22 LAB — INFLUENZA PANEL BY PCR (TYPE A & B)
Influenza A By PCR: NEGATIVE
Influenza B By PCR: NEGATIVE

## 2017-03-22 LAB — LIPASE, BLOOD: Lipase: 31 U/L (ref 11–51)

## 2017-03-22 LAB — PROCALCITONIN

## 2017-03-22 LAB — LACTIC ACID, PLASMA: LACTIC ACID, VENOUS: 1.5 mmol/L (ref 0.5–1.9)

## 2017-03-22 MED ORDER — ACETAMINOPHEN 650 MG RE SUPP
650.0000 mg | Freq: Once | RECTAL | Status: AC
  Administered 2017-03-22: 650 mg via RECTAL
  Filled 2017-03-22: qty 1

## 2017-03-22 MED ORDER — HALOPERIDOL LACTATE 5 MG/ML IJ SOLN
2.5000 mg | Freq: Once | INTRAMUSCULAR | Status: AC
  Administered 2017-03-22: 2.5 mg via INTRAVENOUS

## 2017-03-22 MED ORDER — LORAZEPAM 2 MG/ML IJ SOLN
2.0000 mg | Freq: Once | INTRAMUSCULAR | Status: AC
  Administered 2017-03-23: 2 mg via INTRAVENOUS
  Filled 2017-03-22: qty 1

## 2017-03-22 MED ORDER — LORAZEPAM 2 MG/ML IJ SOLN
2.0000 mg | Freq: Once | INTRAMUSCULAR | Status: AC
  Administered 2017-03-22: 2 mg via INTRAVENOUS
  Filled 2017-03-22: qty 1

## 2017-03-22 MED ORDER — SODIUM CHLORIDE 0.9 % IV BOLUS (SEPSIS)
1000.0000 mL | Freq: Once | INTRAVENOUS | Status: AC
  Administered 2017-03-22: 1000 mL via INTRAVENOUS

## 2017-03-22 MED ORDER — CEFTRIAXONE SODIUM IN DEXTROSE 20 MG/ML IV SOLN
1.0000 g | Freq: Once | INTRAVENOUS | Status: AC
  Administered 2017-03-22: 1 g via INTRAVENOUS
  Filled 2017-03-22: qty 50

## 2017-03-22 MED ORDER — HALOPERIDOL LACTATE 5 MG/ML IJ SOLN
INTRAMUSCULAR | Status: AC
  Administered 2017-03-22: 2.5 mg via INTRAVENOUS
  Filled 2017-03-22: qty 1

## 2017-03-22 MED ORDER — HALOPERIDOL LACTATE 5 MG/ML IJ SOLN
2.5000 mg | Freq: Once | INTRAMUSCULAR | Status: AC
Start: 2017-03-22 — End: 2017-03-22
  Administered 2017-03-22: 2.5 mg via INTRAVENOUS
  Filled 2017-03-22: qty 1

## 2017-03-22 NOTE — ED Triage Notes (Signed)
Pt here with erratic behavior from home via [facility]. EMS reports CBG of 555, pt was violent at home, 2.5mg  of versed given en route.

## 2017-03-22 NOTE — ED Notes (Signed)
Pt agitated, trying to get out of bed.  EDP notified; see new orders.

## 2017-03-22 NOTE — ED Notes (Signed)
Pt continues to be physically and verbally aggressive with sitter, trying to get out of bed.  EDP notified; see new orders.

## 2017-03-22 NOTE — Progress Notes (Signed)
CODE SEPSIS - PHARMACY COMMUNICATION  **Broad Spectrum Antibiotics should be administered within 1 hour of Sepsis diagnosis**  Time Code Sepsis Called/Page Received: 17:31  Antibiotics Ordered: Ceftriaxone  Time of 1st antibiotic administration: 17:40  Additional action taken by pharmacy: NA  If necessary, Name of Provider/Nurse Contacted: NA    Foye DeerLisa G Calynn Ferrero ,PharmD Clinical Pharmacist  03/22/2017  6:03 PM

## 2017-03-22 NOTE — ED Provider Notes (Addendum)
[facility] Emergency Department Provider Note  ____________________________________________   First MD Initiated Contact with Patient 03/22/17 1725     (approximate)  I have reviewed the triage vital signs and the nursing notes.   HISTORY  Chief Complaint Psychiatric Evaluation  Level 5 exemption history limited by the patient's dementia  HPI David Mccormick is a 77 y.o. male who comes to the emergency department via EMS with increasingly agitated behavior at home.  Apparently the patient lives with his son and the patient's Alzheimer's has become progressively more severe and he has become agitated, uncooperative, and violent at home.  He was given 2.5 mg of midozalam intramuscularly in route secondary to combative behavior.  Past Medical History:  Diagnosis Date  . Alzheimer's dementia   . Arthritis   . Diabetes mellitus without complication (HCC)    since 2008  . Hypertension    since 2008  . Personal history of colonic polyps 2012  . Screening for obesity   . Special screening for malignant neoplasms, colon     Patient Active Problem List   Diagnosis Date Noted  . Personal history of colonic polyps     Past Surgical History:  Procedure Laterality Date  . COLONOSCOPY  0454,[id number],2012   20o6 with polyp sigmoid colon 60 cm/30cm colon snare. 2012 by Dr. Lemar LivingsByrnett  . TONSILLECTOMY  1972    Prior to Admission medications   Medication Sig Start Date End Date Taking? Authorizing Provider  acetaminophen (TYLENOL) 325 MG tablet Take 2 tablets (650 mg total) by mouth every 6 (six) hours as needed. 02/14/17   Sharman CheekStafford, Phillip, MD  ondansetron (ZOFRAN ODT) 4 MG disintegrating tablet Take 1 tablet (4 mg total) by mouth every 8 (eight) hours as needed for nausea or vomiting. 02/14/17   Sharman CheekStafford, Phillip, MD  polyethylene glycol powder (GLYCOLAX/MIRALAX) powder Take 17 g by mouth 2 (two) times daily. You may stop medication was the patient has several bowel  movements. 03/18/17   Minna AntisPaduchowski, Kevin, MD  tamsulosin (FLOMAX) 0.4 MG CAPS capsule Take 1 capsule (0.4 mg total) by mouth daily. 02/14/17   Sharman CheekStafford, Phillip, MD    Allergies Patient has no known allergies.  No family history on file.  Social History Social History   Tobacco Use  . Smoking status: Never Smoker  . Smokeless tobacco: Never Used  Substance Use Topics  . Alcohol use: No  . Drug use: No    Review of Systems Level 5 exemption history limited by the patient's dementia ____________________________________________   PHYSICAL EXAM:  VITAL SIGNS: ED Triage Vitals  Enc Vitals Group     BP      Pulse      Resp      Temp      Temp src      SpO2      Weight      Height      Head Circumference      Peak Flow      Pain Score      Pain Loc      Pain Edu?      Excl. in GC?     Constitutional: Agitated, fighting, uncooperative.  No diaphoresis. Eyes: PERRL EOMI. mid range and brisk Head: Atraumatic. Nose: No congestion/rhinnorhea. Mouth/Throat: No trismus Neck: No stridor.   Cardiovascular: Normal rate, regular rhythm. Grossly normal heart sounds.  Good peripheral circulation. Respiratory: Normal respiratory effort.  No retractions. Lungs CTAB and moving good air Gastrointestinal: Soft nontender Musculoskeletal: No  lower extremity edema   Neurologic:  . No gross focal neurologic deficits are appreciated. Skin:  Skin is warm, dry and intact. No rash noted. Psychiatric: Severe dementia agitated and uncooperative    ____________________________________________   DIFFERENTIAL includes but not limited to  Urinary tract infection, metabolic derangement, pneumonia, influenza, dehydration, Alzheimer's ____________________________________________   LABS (all labs ordered are listed, but only abnormal results are displayed)  Labs Reviewed  COMPREHENSIVE METABOLIC PANEL - Abnormal; Notable for the following components:      Result Value   Chloride 100  (*)    Glucose, Bld 425 (*)    BUN 24 (*)    Calcium 8.8 (*)    Total Protein 6.4 (*)    All other components within normal limits  CBC WITH DIFFERENTIAL/PLATELET - Abnormal; Notable for the following components:   RBC 3.74 (*)    Hemoglobin 12.5 (*)    HCT 37.3 (*)    All other components within normal limits  URINALYSIS, ROUTINE W REFLEX MICROSCOPIC - Abnormal; Notable for the following components:   Color, Urine YELLOW (*)    APPearance CLEAR (*)    Glucose, UA >=500 (*)    Hgb urine dipstick MODERATE (*)    Ketones, ur 20 (*)    Squamous Epithelial / LPF 0-5 (*)    All other components within normal limits  CULTURE, BLOOD (ROUTINE X 2)  CULTURE, BLOOD (ROUTINE X 2)  URINE CULTURE  LACTIC ACID, PLASMA  LIPASE, BLOOD  TROPONIN I  PROCALCITONIN  PROTIME-INR  INFLUENZA PANEL BY PCR (TYPE A & B)  LACTIC ACID, PLASMA  BLOOD GAS, VENOUS    Lab work reviewed by me with elevated glucose although no evidence of diabetic ketoacidosis.  No evidence of infection __________________________________________  EKG  ED ECG REPORT I, Merrily Brittle, the attending physician, personally viewed and interpreted this ECG.  Date: 03/22/2017 EKG Time:  Rate: 75 Rhythm: normal sinus rhythm QRS Axis: normal Intervals: normal ST/T Wave abnormalities: normal Narrative Interpretation: no evidence of acute ischemia  ____________________________________________  RADIOLOGY  Chest x-ray reviewed by me with no acute disease ____________________________________________   PROCEDURES  Procedure(s) performed: no  .Critical Care Performed by: Merrily Brittle, MD Authorized by: Merrily Brittle, MD   Critical care provider statement:    Critical care time (minutes):  35   Critical care time was exclusive of:  Separately billable procedures and treating other patients   Critical care was necessary to treat or prevent imminent or life-threatening deterioration of the following conditions:   CNS failure or compromise   Critical care was time spent personally by me on the following activities:  Development of treatment plan with patient or surrogate, discussions with consultants, evaluation of patient's response to treatment, examination of patient, obtaining history from patient or surrogate, ordering and performing treatments and interventions, ordering and review of laboratory studies, ordering and review of radiographic studies, pulse oximetry, re-evaluation of patient's condition and review of old charts    Critical Care performed: yes  Observation: no ____________________________________________   INITIAL IMPRESSION / ASSESSMENT AND PLAN / ED COURSE  Pertinent labs & imaging results that were available during my care of the patient were reviewed by me and considered in my medical decision making (see chart for details).  On arrival the patient's initial oral temperature was 100.1 degrees raising concern for an infectious etiology of his symptoms, however rectal temperature was normal and the oral temperature was likely a false positive.  Code sepsis initially was called  and a gram of ceftriaxone given, however after an exhaustive search for an infectious etiology of his symptoms the patient's lab work was negative.  He is required multiple doses of antipsychotics as well as a dose of Ativan secondary to behavioral disturbances.  At this point there is no organic cause to his behavior and he likely has progression of his Alzheimer's disease along with behavioral disturbance.  I have consulted psychiatry, TTS, and social work for possible Geri psychiatric placement.      ____________________________________________   FINAL CLINICAL IMPRESSION(S) / ED DIAGNOSES  Final diagnoses:  Alzheimer's dementia with behavioral disturbance, unspecified timing of dementia onset      NEW MEDICATIONS STARTED DURING THIS VISIT:  This SmartLink is deprecated. Use AVSMEDLIST instead to  display the medication list for a patient.   Note:  This document was prepared using Dragon voice recognition software and may include unintentional dictation errors.     Merrily Brittle, MD 03/22/17 2039    Merrily Brittle, MD 03/22/17 2040    Merrily Brittle, MD 03/23/17 1610

## 2017-03-22 NOTE — BH Assessment (Signed)
Assessment Note  David BridgemanRaymond C. Mccormick Standardllison is an 77 y.o. male, with a history of Alzheimer's Dementia, presenting to the ED from home for concerns with erratic and aggressive behavior at home.  Patient's son report that patient's Alzheimer's has become more progressive and severe to point in which David Mccormick become agitated, uncooperative and violent towards others in the home.  David Mccormick currently lives with his family.  David Mccormick history limited du to patient's dementia.  Diagnosis: Alzheimer's Dementia  Past Medical History:  Past Medical History:  Diagnosis Date  . Alzheimer's dementia   . Arthritis   . Diabetes mellitus without complication (HCC)    since 2008  . Hypertension    since 2008  . Personal history of colonic polyps 2012  . Screening for obesity   . Special screening for malignant neoplasms, colon     Past Surgical History:  Procedure Laterality Date  . COLONOSCOPY  [id number],2012   20o6 with polyp sigmoid colon 60 cm/30cm colon snare. 2012 by Dr. Lemar LivingsByrnett  . TONSILLECTOMY  1972    Family History: No family history on file.  Social History:  reports that  has never smoked. he has never used smokeless tobacco. He reports that he does not drink alcohol or use drugs.  Additional Social History:  Alcohol / Drug Use Pain Medications: See PTA Prescriptions: See PTA Over the Counter: See PTA History of alcohol / drug use?: No history of alcohol / drug abuse  CIWA: CIWA-Ar BP: 121/62 Pulse Rate: 74 COWS:    Allergies: No Known Allergies  Home Medications:  (Not in a hospital admission)  OB/GYN Status:  No LMP for male patient.  General Assessment Data Assessment unable to be completed: Yes Reason for not completing assessment: David Mccormick has dementia [facility] Assessment: In system Is this a Tele or Face-to-Face Assessment?: Face-to-Face Is this an Initial Assessment or a Re-assessment for this encounter?: Initial Assessment Marital status: Married SangreyMaiden name: na Is  patient pregnant?: No Pregnancy Status: No Living Arrangements: Spouse/significant other Can David Mccormick return to current living arrangement?: Yes Admission Status: Voluntary Is patient capable of signing voluntary admission?: No Referral Source: Self/Family/Friend Insurance type: ParamedicAetna Medicare     Crisis Care Plan Living Arrangements: Spouse/significant other Legal Guardian: Other: Name of Psychiatrist: none reported Name of Therapist: none reported  Education Status Is patient currently in school?: No Current Grade: na Highest grade of school patient has completed: na Name of school: na Contact person: na  Risk to self with the past 6 months Suicidal Ideation: No Has patient been a risk to self within the past 6 months prior to admission? : No Suicidal Intent: No Has patient had any suicidal intent within the past 6 months prior to admission? : No Is patient at risk for suicide?: No Suicidal Plan?: No Has patient had any suicidal plan within the past 6 months prior to admission? : No Access to Means: No What has been your use of drugs/alcohol within the last 12 months?: none reported Previous Attempts/Gestures: No Triggers for Past Attempts: None known Intentional Self Injurious Behavior: None Family Suicide History: Unknown Recent stressful life event(s): Recent negative physical changes Persecutory voices/beliefs?: No Depression: No Substance abuse history and/or treatment for substance abuse?: No Suicide prevention information given to non-admitted patients: Not applicable  Risk to Others within the past 6 months Homicidal Ideation: No Does patient have any lifetime risk of violence toward others beyond the six months prior to admission? : Unknown Thoughts of Harm  to Others: No Current Homicidal Intent: No Current Homicidal Plan: No Access to Homicidal Means: No History of harm to others?: No Assessment of Violence: None Noted Does patient have access to weapons?:  No Criminal Charges Pending?: No Does patient have a court date: No Is patient on probation?: No  Psychosis Hallucinations: None noted Delusions: None noted  Mental Status Report Appearance/Hygiene: Disheveled Eye Contact: Unable to Assess Motor Activity: Agitation Speech: Incoherent Level of Consciousness: Restless Mood: Other (Comment) Affect: Inconsistent with thought content Anxiety Level: None Thought Processes: Irrelevant Judgement: Unable to Assess Orientation: Not oriented Obsessive Compulsive Thoughts/Behaviors: Unable to Assess  Cognitive Functioning Concentration: Poor Memory: Remote Impaired, Recent Impaired IQ: Average Insight: Unable to Assess Impulse Control: Unable to Assess Appetite: Fair Sleep: Unable to Assess Vegetative Symptoms: Unable to Assess  ADLScreening [facility] Assessment Services) Patient's cognitive ability adequate to safely complete daily activities?: No Patient able to express need for assistance with ADLs?: No Independently performs ADLs?: No  Prior Inpatient Therapy Prior Inpatient Therapy: No Prior Therapy Dates: na Prior Therapy Facilty/Provider(s): na Reason for Treatment: na  Prior Outpatient Therapy Prior Outpatient Therapy: No Prior Therapy Dates: na Prior Therapy Facilty/Provider(s): na Reason for Treatment: na Does patient have an ACCT team?: No Does patient have Intensive In-House Services?  : No Does patient have [facility] services? : Unknown Does patient have P4CC services?: Unknown  ADL Screening (condition at time of admission) Patient's cognitive ability adequate to safely complete daily activities?: No Patient able to express need for assistance with ADLs?: No Independently performs ADLs?: No       Abuse/Neglect Assessment (Assessment to be complete while patient is alone) Abuse/Neglect Assessment Can Be Completed: Unable to assess, patient is non-responsive or altered mental status Values / Beliefs Cultural  Requests During Hospitalization: None Spiritual Requests During Hospitalization: None Consults Spiritual Care Consult Needed: No Social Work Consult Needed: Yes (Comment) Advance Directives (For Healthcare) Does Patient Have a Medical Advance Directive?: Yes Does patient want to make changes to medical advance directive?: No - Patient declined Type of Advance Directive: Healthcare Power of Attorney Copy of Healthcare Power of Attorney in Chart?: No - copy requested    Additional Information 1:1 In Past 12 Months?: No CIRT Risk: No Elopement Risk: No Does patient have medical clearance?: Yes     Disposition:  Disposition Initial Assessment Completed for this Encounter: Yes Disposition of Patient: Pending Review with psychiatrist  On Site Evaluation by:   Reviewed with Physician:    [place] 03/22/2017 11:26 PM

## 2017-03-23 LAB — BASIC METABOLIC PANEL
Anion gap: 11 (ref 5–15)
BUN: 15 mg/dL (ref 6–20)
CO2: 24 mmol/L (ref 22–32)
Calcium: 8.4 mg/dL — ABNORMAL LOW (ref 8.9–10.3)
Chloride: 101 mmol/L (ref 101–111)
Creatinine, Ser: 1.02 mg/dL (ref 0.61–1.24)
GFR calc Af Amer: >60 mL/min (ref >60)
Glucose, Bld: 475 mg/dL — ABNORMAL HIGH (ref 65–99)
POTASSIUM: 4 mmol/L (ref 3.5–5.1)
SODIUM: 136 mmol/L (ref 135–145)

## 2017-03-23 LAB — GLUCOSE, CAPILLARY
GLUCOSE-CAPILLARY: 409 mg/dL — AB (ref 65–99)
Glucose-Capillary: 115 mg/dL — ABNORMAL HIGH (ref 65–99)
Glucose-Capillary: 273 mg/dL — ABNORMAL HIGH (ref 65–99)

## 2017-03-23 MED ORDER — INSULIN ASPART 100 UNIT/ML ~~LOC~~ SOLN
0.0000 [IU] | Freq: Every day | SUBCUTANEOUS | Status: DC
  Administered 2017-03-23: 3 [IU] via SUBCUTANEOUS
  Filled 2017-03-23: qty 1

## 2017-03-23 MED ORDER — METRONIDAZOLE 500 MG PO TABS
ORAL_TABLET | ORAL | Status: AC
  Filled 2017-03-23: qty 2

## 2017-03-23 MED ORDER — OLANZAPINE 10 MG IM SOLR
2.5000 mg | Freq: Once | INTRAMUSCULAR | Status: AC
  Administered 2017-03-23: 2.5 mg via INTRAMUSCULAR
  Filled 2017-03-23: qty 10

## 2017-03-23 MED ORDER — INSULIN ASPART 100 UNIT/ML ~~LOC~~ SOLN
0.0000 [IU] | Freq: Three times a day (TID) | SUBCUTANEOUS | Status: DC
  Administered 2017-03-24: 5 [IU] via SUBCUTANEOUS
  Filled 2017-03-23: qty 1

## 2017-03-23 MED ORDER — INSULIN ASPART 100 UNIT/ML ~~LOC~~ SOLN
10.0000 [IU] | Freq: Once | SUBCUTANEOUS | Status: AC
  Administered 2017-03-23: 10 [IU] via INTRAVENOUS
  Filled 2017-03-23: qty 1

## 2017-03-23 NOTE — ED Provider Notes (Signed)
[facility]. Initially wife was resistant to patient being Transferred there, but now understands that this is the next appropriate step in the patient's psychiatric care. This was discussed with her by psychiatry Dr. Toni Amendlapacs. Patient is medically stable with normal vital signs.   Sharman CheekStafford, Adar Rase, MD 03/23/17 [phone]

## 2017-03-23 NOTE — ED Notes (Signed)
Patient given meal tray.

## 2017-03-23 NOTE — ED Notes (Signed)
Patient continues to sleep soundly. Repositioned in bed. Safety sitter remains at bedside.

## 2017-03-23 NOTE — BH Assessment (Addendum)
This Clinical research associatewriter contacted patient's wife at [phone] to inform of patient's referral to [facility] in [place]. Wife expressed she didn't want patient referred to [facility] due to the distance. It was explained to wife, this level of care if appropriate due to geriatric mental health treatment provided at [facility]. Wife was given the option to pick up patient from ED. Wife stated she is power of attorney for patient. Wife agreed with referral to [facility]. Wife was provided with contact information including telephone number and address to [facility] in [place], [place].

## 2017-03-23 NOTE — ED Notes (Signed)
Family in the waiting room requesting that they see patient. Given paper with visitor hours and assured that patient is being cared for appropriately. Pt son very understanding. Reports that wife of patient is HCPOA

## 2017-03-23 NOTE — ED Notes (Addendum)
Pt glucose sensor removed to left posterior upper arm; placed in sterile cup and with belongings. Pt CBG checked. Pt continues to rest. Noncombative. VSS Sitter at bedside.

## 2017-03-23 NOTE — BH Assessment (Signed)
?   Referral for Geriatric placement submitted to the following:  ? [facility] (p-[phone]/f-[phone])  ? [facility] ([phone]/[phone])  ? [facility] (p-[phone]/f-[phone])  ? [facility] (f-[phone])  ? [facility] ([phone]/f-[phone])  ? [facility] [phone] 

## 2017-03-23 NOTE — ED Notes (Signed)
Patient became agitated when brief was changed, but calm back down shortly thereafter. Sitter remains at bedside.

## 2017-03-23 NOTE — ED Notes (Signed)
Patient now is not being discharged until tomorrow. Sitter aware.

## 2017-03-23 NOTE — BH Assessment (Signed)
This Clinical research associatewriter received information from ER Secretary Glenda patient will not be able to transported to [facility] [place] until tomorrow @ 8:30am. This Clinical research associatewriter contacted [facility] and spoke with Trula OreChristina to inform of updated transportation information. Trula OreChristina stated patient can be transported tomorrow morning and bed will still be available.

## 2017-03-23 NOTE — ED Notes (Addendum)
Report to Sonja, RN 

## 2017-03-23 NOTE — BH Assessment (Addendum)
Patient has been accepted to [facility] in [place]  [facility] Accepting physician is Dr. Avanell ShackletonAngela Gannon.  Call report to [phone].  Representative was Deawna.  ER Staff is aware of it Vernard Gambles(Emilee, ER Sect.; Dr. Scotty CourtStafford, ER MD & Connye BurkittAlly Patient's Nurse)    Patient's Family/Support System (Nancy-wife @ [phone]) have been updated as well.

## 2017-03-23 NOTE — ED Notes (Signed)
Pt brief changed, incontinent of urine. Pt agitated but noncombative at this time. Sitter at bedside.

## 2017-03-23 NOTE — ED Notes (Signed)
IV[facility] in am via [facility]

## 2017-03-23 NOTE — ED Provider Notes (Signed)
-----------------------------------------   6:51 AM on 03/23/2017 -----------------------------------------   Blood pressure 121/62, pulse 74, temperature 98 F (36.7 C), temperature source Rectal, resp. rate (!) 22, weight 77.1 kg (170 lb), SpO2 97 %.  The patient had no acute events since last update.  Calm and cooperative at this time.  Disposition is pending Psychiatry/Behavioral Medicine team recommendations.  The [facility]   Rebecka ApleyWebster, Alderman P, MD 03/23/17 [phone]

## 2017-03-23 NOTE — ED Notes (Signed)
Patient growing restless and attempting to get out of bed. Safety sitter at bedside to ensure patient does not get up and fall.

## 2017-03-23 NOTE — ED Notes (Signed)
Called wife to update on patient acceptance [facility] [place]; wife states that she does not want patient to go there. States that she wanted him to be placed in Alzheimer's unit. Wife reporting that she is patient's Healthcare POA. [facility] intake staff notified and given number of wife to call back.

## 2017-03-24 LAB — GLUCOSE, CAPILLARY: Glucose-Capillary: 213 mg/dL — ABNORMAL HIGH (ref 65–99)

## 2017-03-24 LAB — URINE CULTURE: CULTURE: NO GROWTH

## 2017-03-24 MED ORDER — LORAZEPAM 2 MG/ML IJ SOLN
2.0000 mg | Freq: Once | INTRAMUSCULAR | Status: AC
  Administered 2017-03-24: 2 mg via INTRAVENOUS
  Filled 2017-03-24: qty 1

## 2017-03-24 NOTE — ED Notes (Signed)
Attempted to call report to [facility]. Transferred with no answer. Will call back.

## 2017-03-24 NOTE — ED Notes (Signed)
EMTALA reviewed. 

## 2017-03-24 NOTE — Progress Notes (Signed)
Inpatient Diabetes Program Recommendations  AACE/ADA: New Consensus Statement on Inpatient Glycemic Control (2015)  Target Ranges:  Prepandial:   less than 140 mg/dL      Peak postprandial:   less than 180 mg/dL (1-2 hours)      Critically ill patients:  140 - 180 mg/dL   Results for Clyda HurdleLLISON, Samvel C. (MRN [id number]) as of 03/24/2017 08:29  Ref. Range 03/23/2017 09:34 03/23/2017 20:32 03/23/2017 23:17 03/24/2017 08:13  Glucose-Capillary Latest Ref Range: 65 - 99 mg/dL 657115 (H) 846409 (H)  10 units Novolog 273 (H)  3 units Novolog 213 (H)  5 units Novolog    Admit with: Alzheimer's/ Dementia/ Agitation  History: DM  Home DM Meds: NPH Insulin 32 units QHS       Regular Insulin 16 units with Breakfast/ 16 units with Lunch/ 8 units with Dinner       Metformin 1000 mg BID  Current Insulin Orders: Novolog Moderate Correction Scale/ SSI (0-15 units) TID AC + HS        MD- If patient not discharged home today, please consider the following:  1. Start Lantus 15 units QHS (50% total home dose of basal insulin)  2. Start Novolog Meal Coverage: Novolog 4 units TID with meals (hold if pt eats <50% of meal)  (Use Glycemic Control Order set)     Patient saw his Endocrinologist (Dr. Carlena SaxAnna Solum with [facility]) on 12/29/16 and pt's wife was given the following instructions regarding pt's DM meds: NPH Insulin 32 units QHS Regular Insulin 16 units with Breakfast/ 16 units with Lunch/ 8 units with Dinner Metformin 1000 mg BID     --Will follow patient during hospitalization--  Ambrose FinlandJeannine Johnston Timoteo Carreiro RN, MSN, CDE Diabetes Coordinator Inpatient Glycemic Control Team Team Pager: [phone] (8a-5p)

## 2017-03-24 NOTE — ED Notes (Signed)
Consent for transfer given to CSW by patient's wife, his HCPOA (see note).

## 2017-03-24 NOTE — ED Notes (Signed)
Pt agitated and fighting with sitter. Will not rest, very confused

## 2017-03-24 NOTE — ED Provider Notes (Signed)
-----------------------------------------   6:49 AM on 03/24/2017 -----------------------------------------   Blood pressure (!) 157/87, pulse 74, temperature 98.2 F (36.8 C), temperature source Axillary, resp. rate 14, weight 77.1 kg (170 lb), SpO2 100 %.  The patient had no acute events since last update.  Calm and cooperative at this time.  Disposition is pending Psychiatry/Behavioral Medicine team recommendations.    Dionne BucySiadecki, Sira Adsit, MD 03/24/17 [phone]

## 2017-03-24 NOTE — ED Notes (Signed)
Patient's wife notified of patient's transfer. Wife upset and reports that she was under the impression that patient was being transferred yesterday and [place] and [place]'t [place]. This RN apologized for the miscommunication and explained to wife that we are only able to transfer patients when our transportation services are available. Wife understanding of this and expressed thanks to this RN for notifying her this morning. All questions regarding transfer answered at this time.

## 2017-03-27 LAB — CULTURE, BLOOD (ROUTINE X 2)
CULTURE: NO GROWTH
Culture: NO GROWTH
Special Requests: ADEQUATE
Special Requests: ADEQUATE

## 2017-04-22 DEATH — deceased

## 2019-03-06 IMAGING — CT CT CERVICAL SPINE W/O CM
3 of 7 series · 11 of 33 positions shown, 12 images · non-contrast
Comparison: CT head 09/13/2012

CLINICAL DATA: Fall.

EXAM:
CT HEAD WITHOUT CONTRAST
CT CERVICAL SPINE WITHOUT CONTRAST
TECHNIQUE: Multidetector CT imaging of the head and cervical spine was
performed following the standard protocol without intravenous
contrast. Multiplanar CT image reconstructions of the cervical spine
were also generated.

[Series 5: coronal soft tissue · coronal · 0.29mm/px · 3 of 63 slices shown]
[im 16/63  bone]
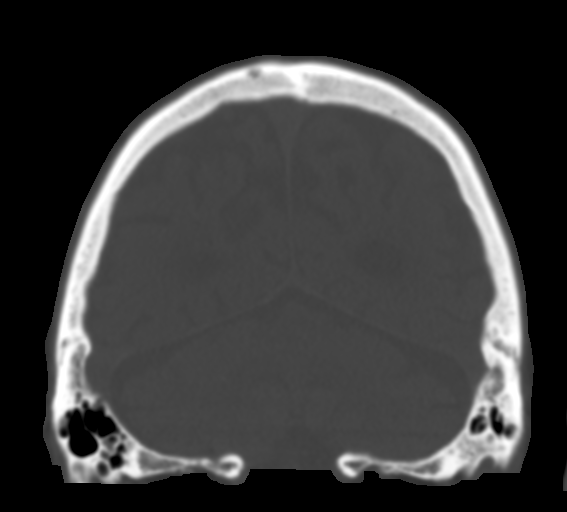
[im 32/63  bone]
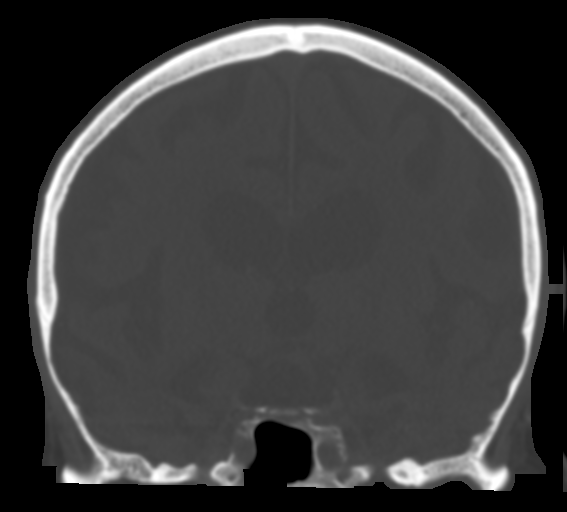
[im 47/63  bone]
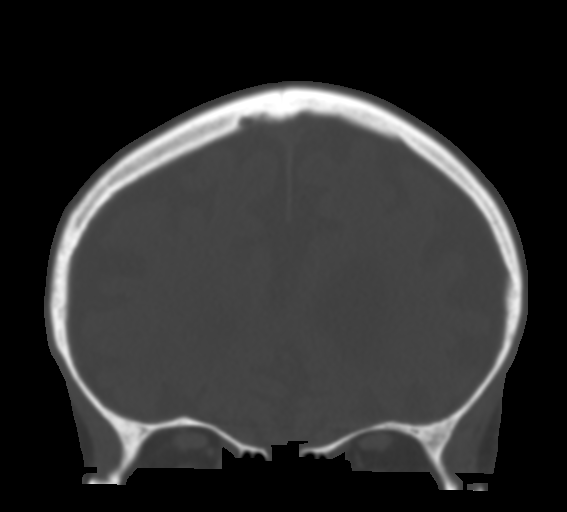

[Series 8: sagittal bone · sagittal · 0.22mm/px · 5 of 77 slices shown]
[im 11/77  bone]
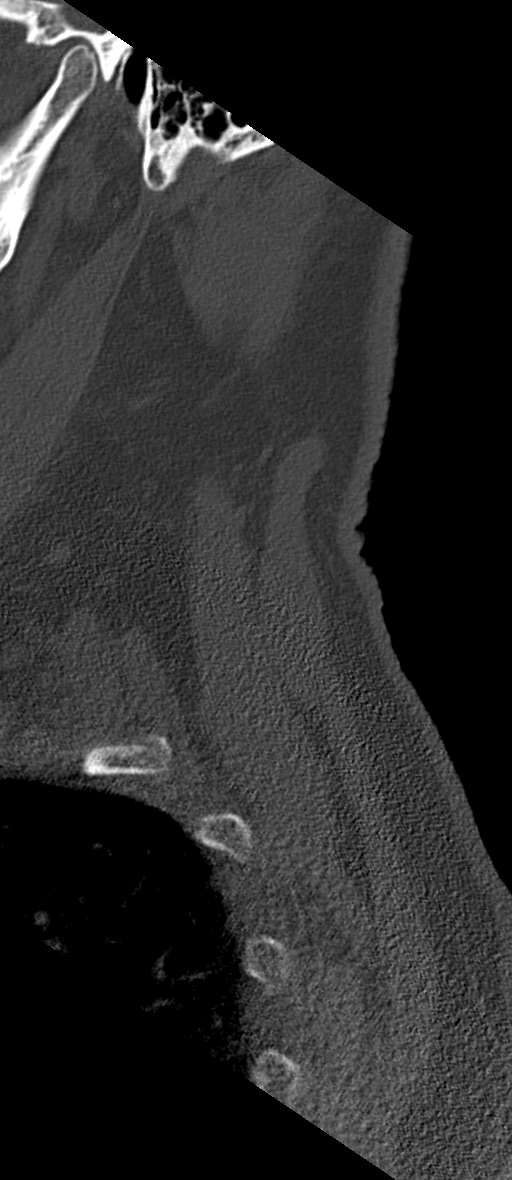
[im 22/77  bone]
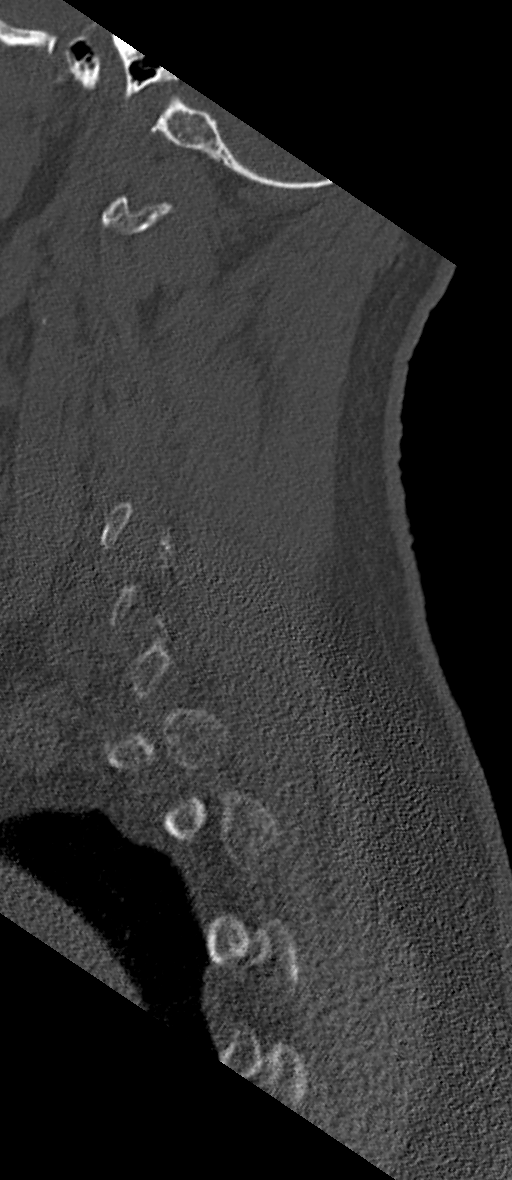
[im 33/77  bone]
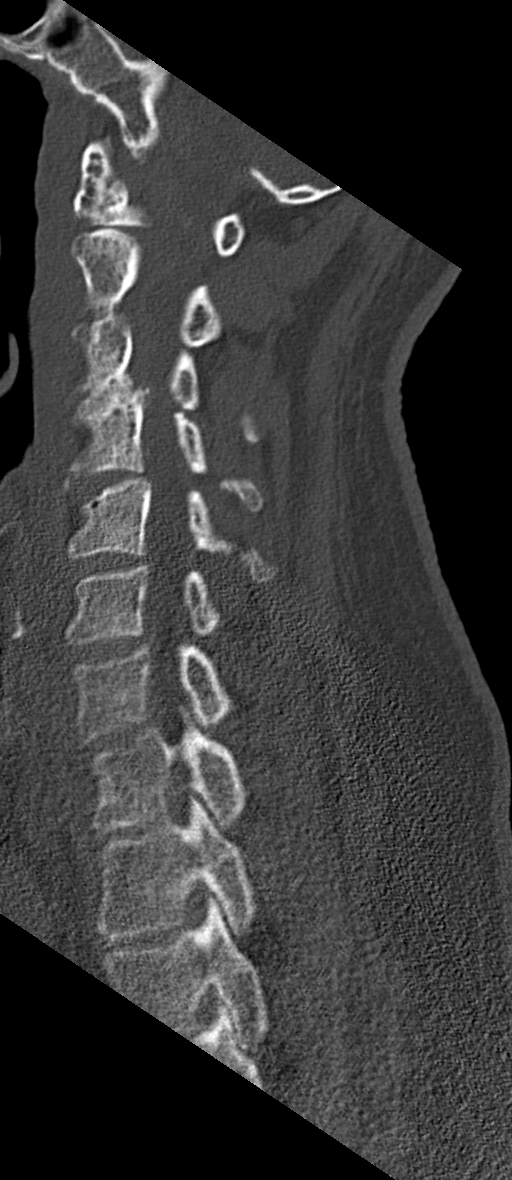
[im 44/77  bone]
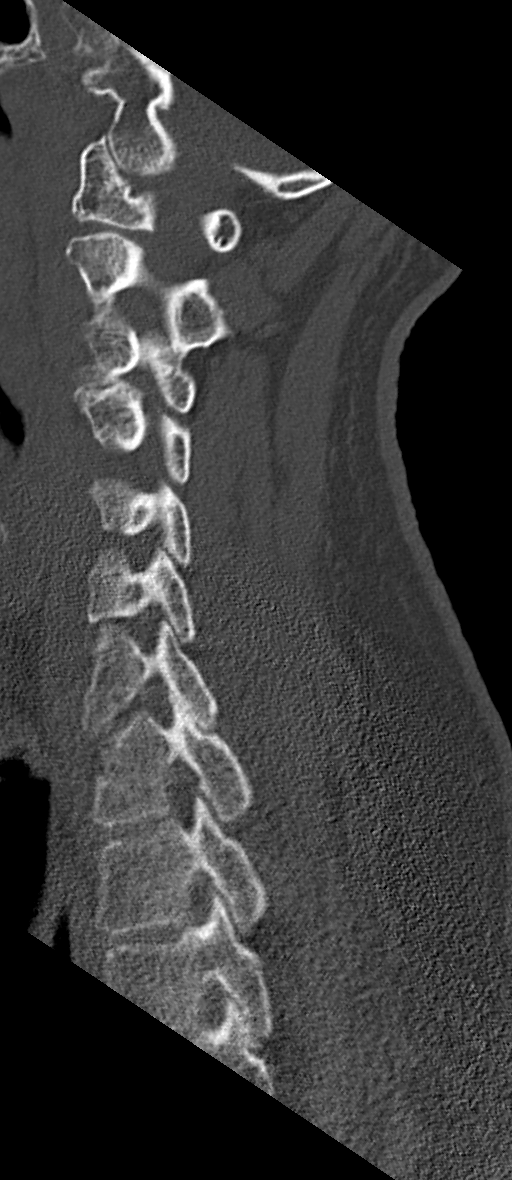
[im 55/77  bone]
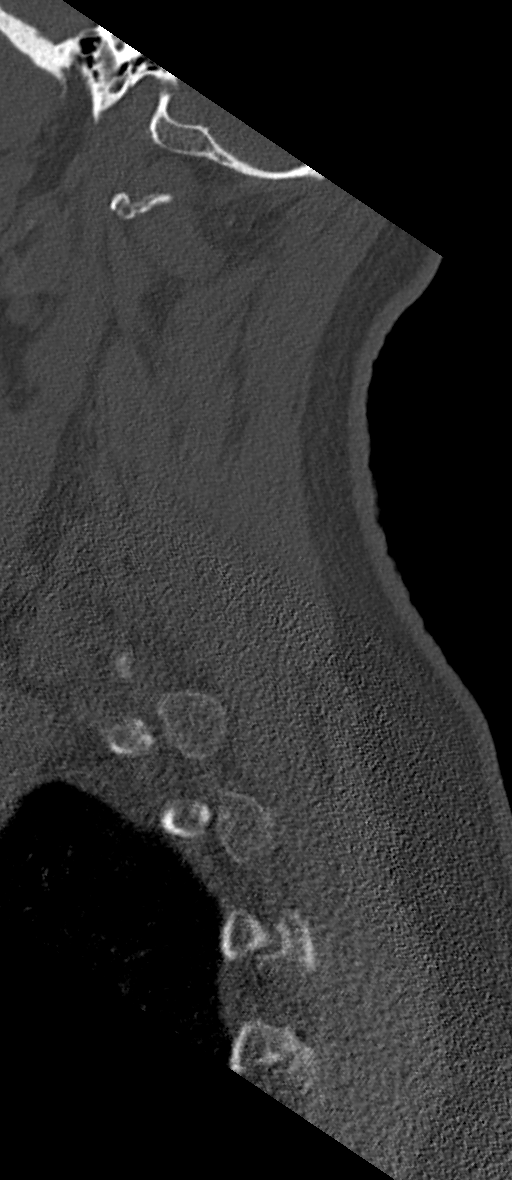

[Series 10: orthogonal bone · axial · 0.22mm/px · z∈[+222,+368]mm · 3 of 133 slices shown, 4 images]
[im 23/133  soft-tissue]
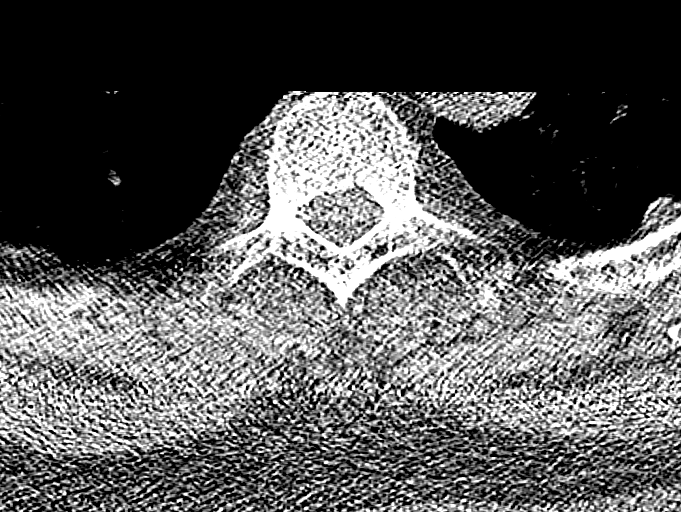
[im 23/133  bone]
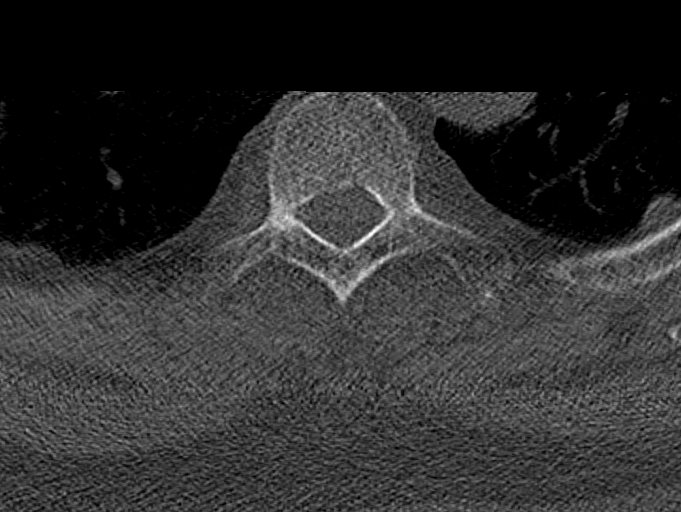
[im 67/133  bone]
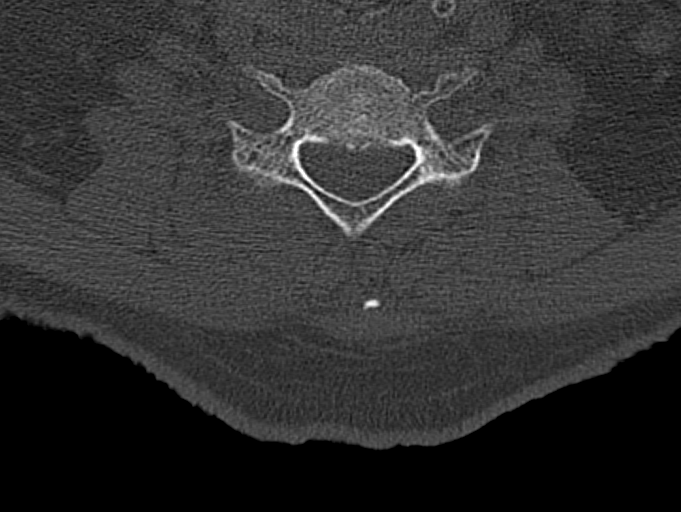
[im 111/133  bone]
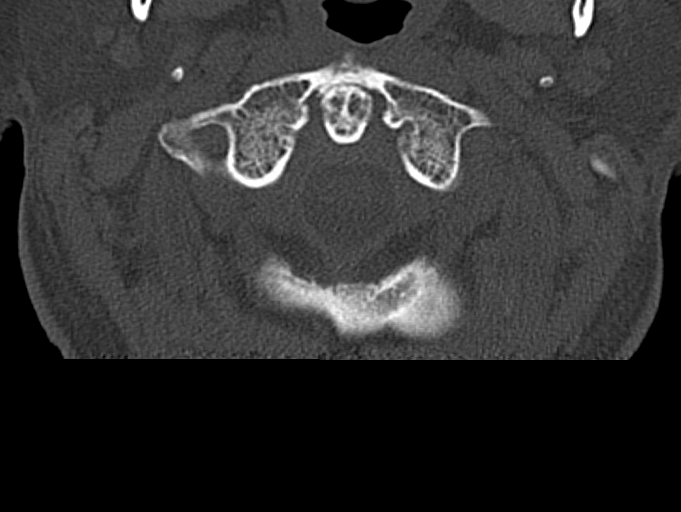

[11 of 33 positions shown; findings below may reference images not displayed]

FINDINGS: CT HEAD FINDINGS

Brain: Advanced atrophy. Negative for acute infarct, hemorrhage, or
mass lesion.

Vascular: Negative for hyperdense vessel

Skull: Negative for skull fracture

Sinuses/Orbits: Negative

Other: None

CT CERVICAL SPINE FINDINGS

Alignment: Anterolisthesis C3-4 and C4-5.

Skull base and vertebrae: Negative for fracture

Soft tissues and spinal canal: Negative

Disc levels: Mild disc degeneration C3-4 and C4-5. No significant
spurring.

Upper chest: Negative

Other: None
IMPRESSION: 1. Advanced cerebral atrophy without acute abnormality
2. Negative for cervical fracture

## 2019-03-17 IMAGING — DX DG ABDOMEN 1V
2 series · 2 of 2 positions shown · non-contrast
Comparison: CT scan abdomen dated 03/16/2017

CLINICAL DATA: Constipation.  Progressive altered mental status.

EXAM:
ABDOMEN - 1 VIEW

[abdomen kub (1 of 2)]
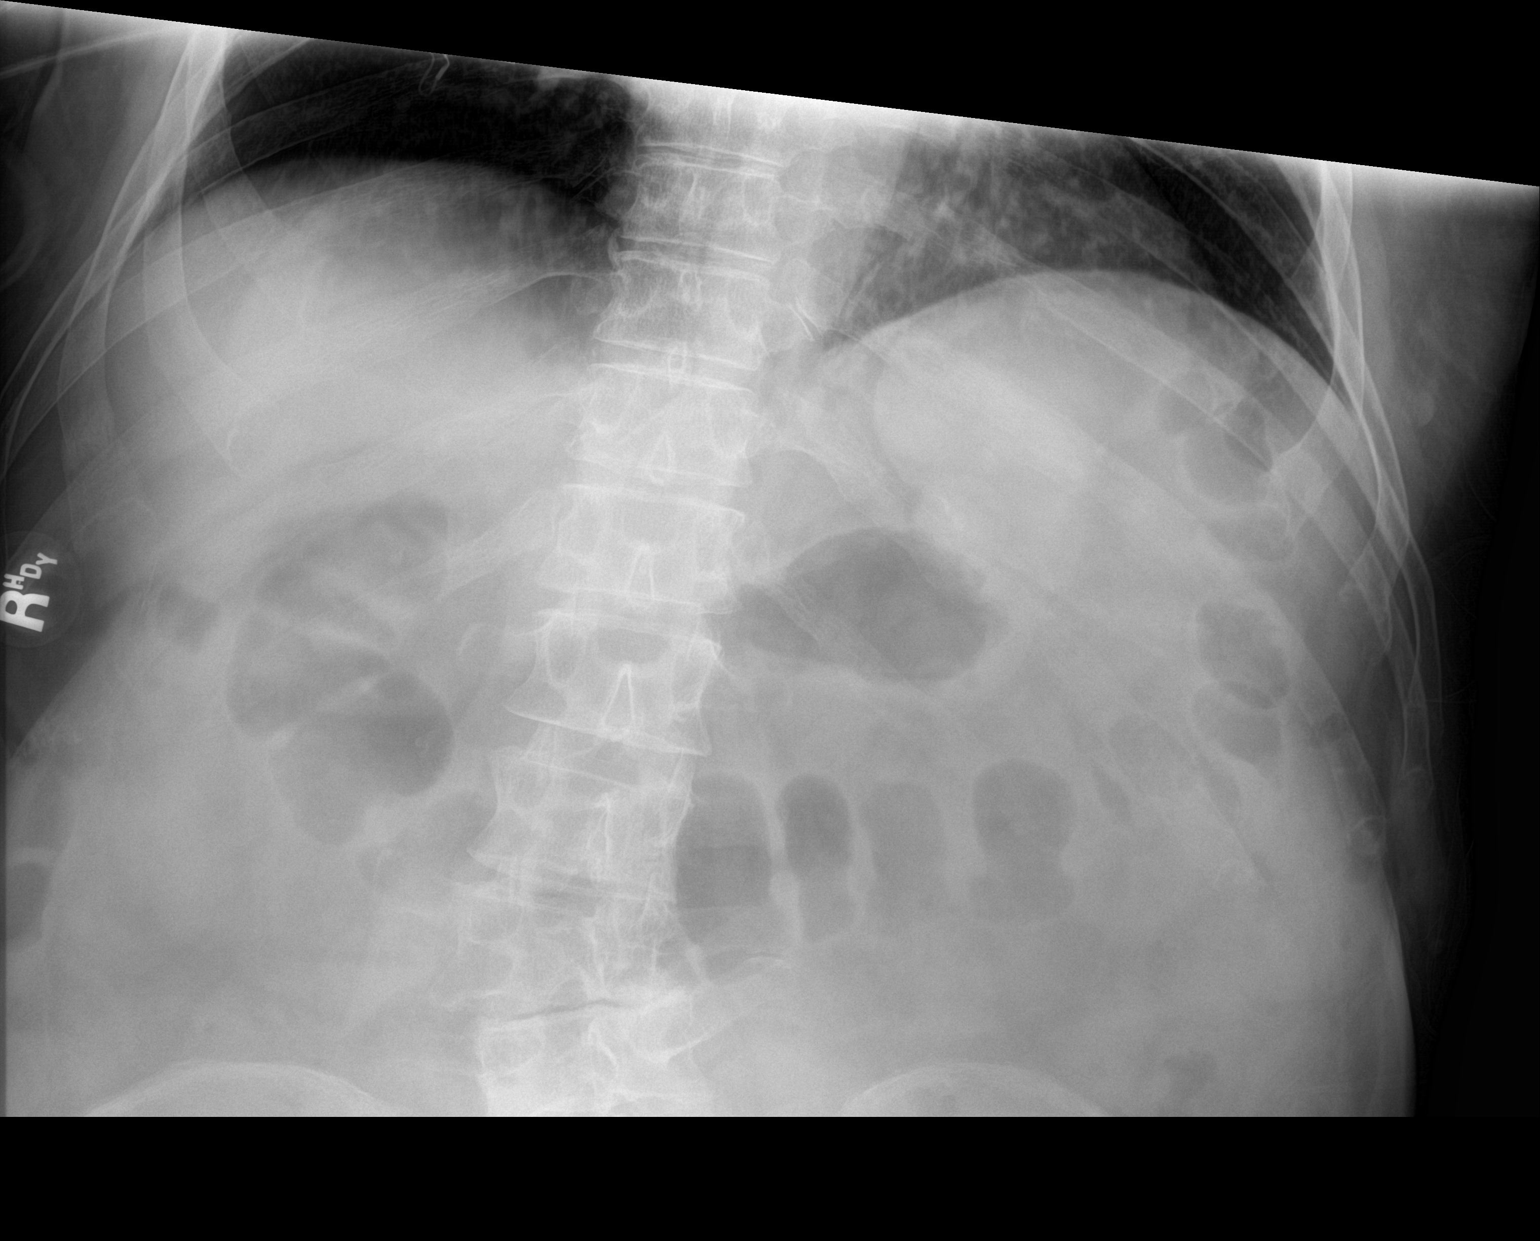

[abdomen kub (2 of 2)]
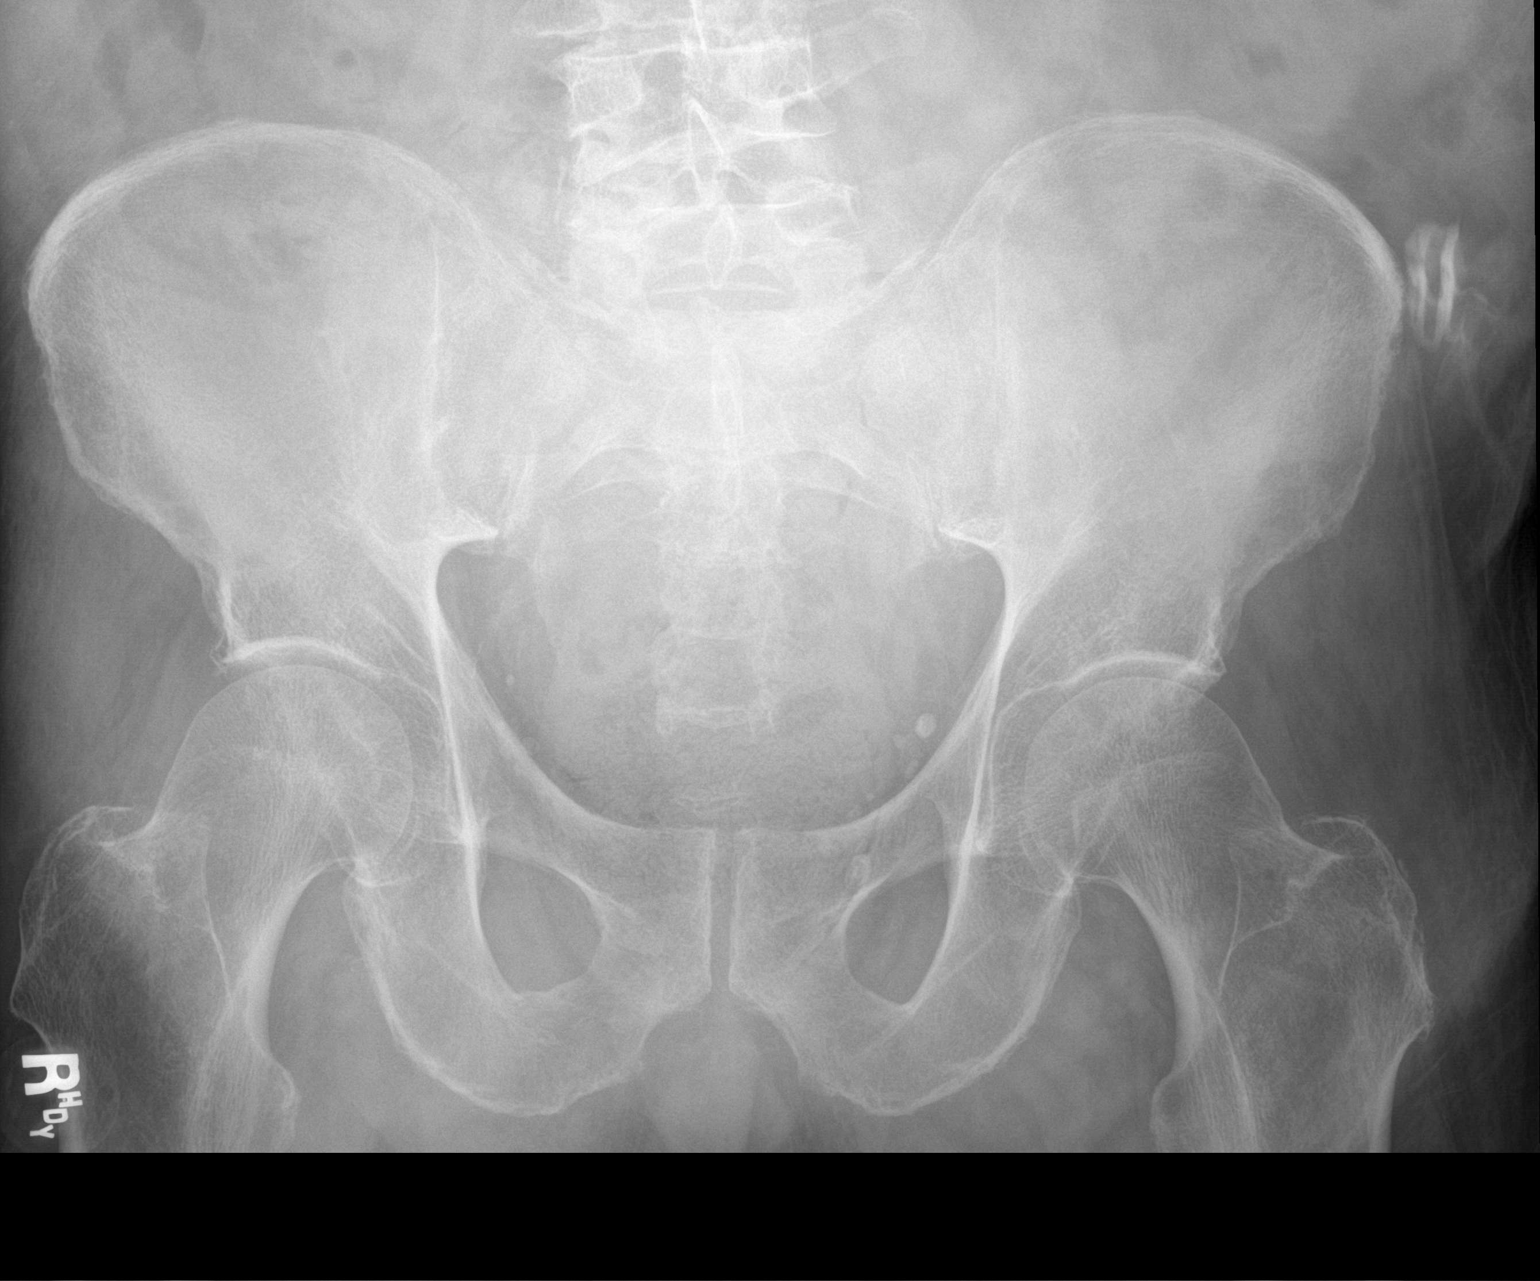

[2 of 2 positions shown; findings below may reference images not displayed]

FINDINGS: The bowel gas pattern is normal.  Moderate stool in the rectum.

The left ureteral stones seen on the prior CT scan is not
appreciable on these radiographs.

Chronic lumbar scoliosis.  No acute bone abnormality.
IMPRESSION: 1. Moderate stool in the rectum.
2. Otherwise, benign appearing abdomen.

## 2019-03-21 IMAGING — DX DG CHEST 1V PORT
1 series · 1 of 1 positions shown · non-contrast
Comparison: 03/18/2017

CLINICAL DATA: Patient presents with erratic behavior.

EXAM:
PORTABLE CHEST 1 VIEW

[chest ap]
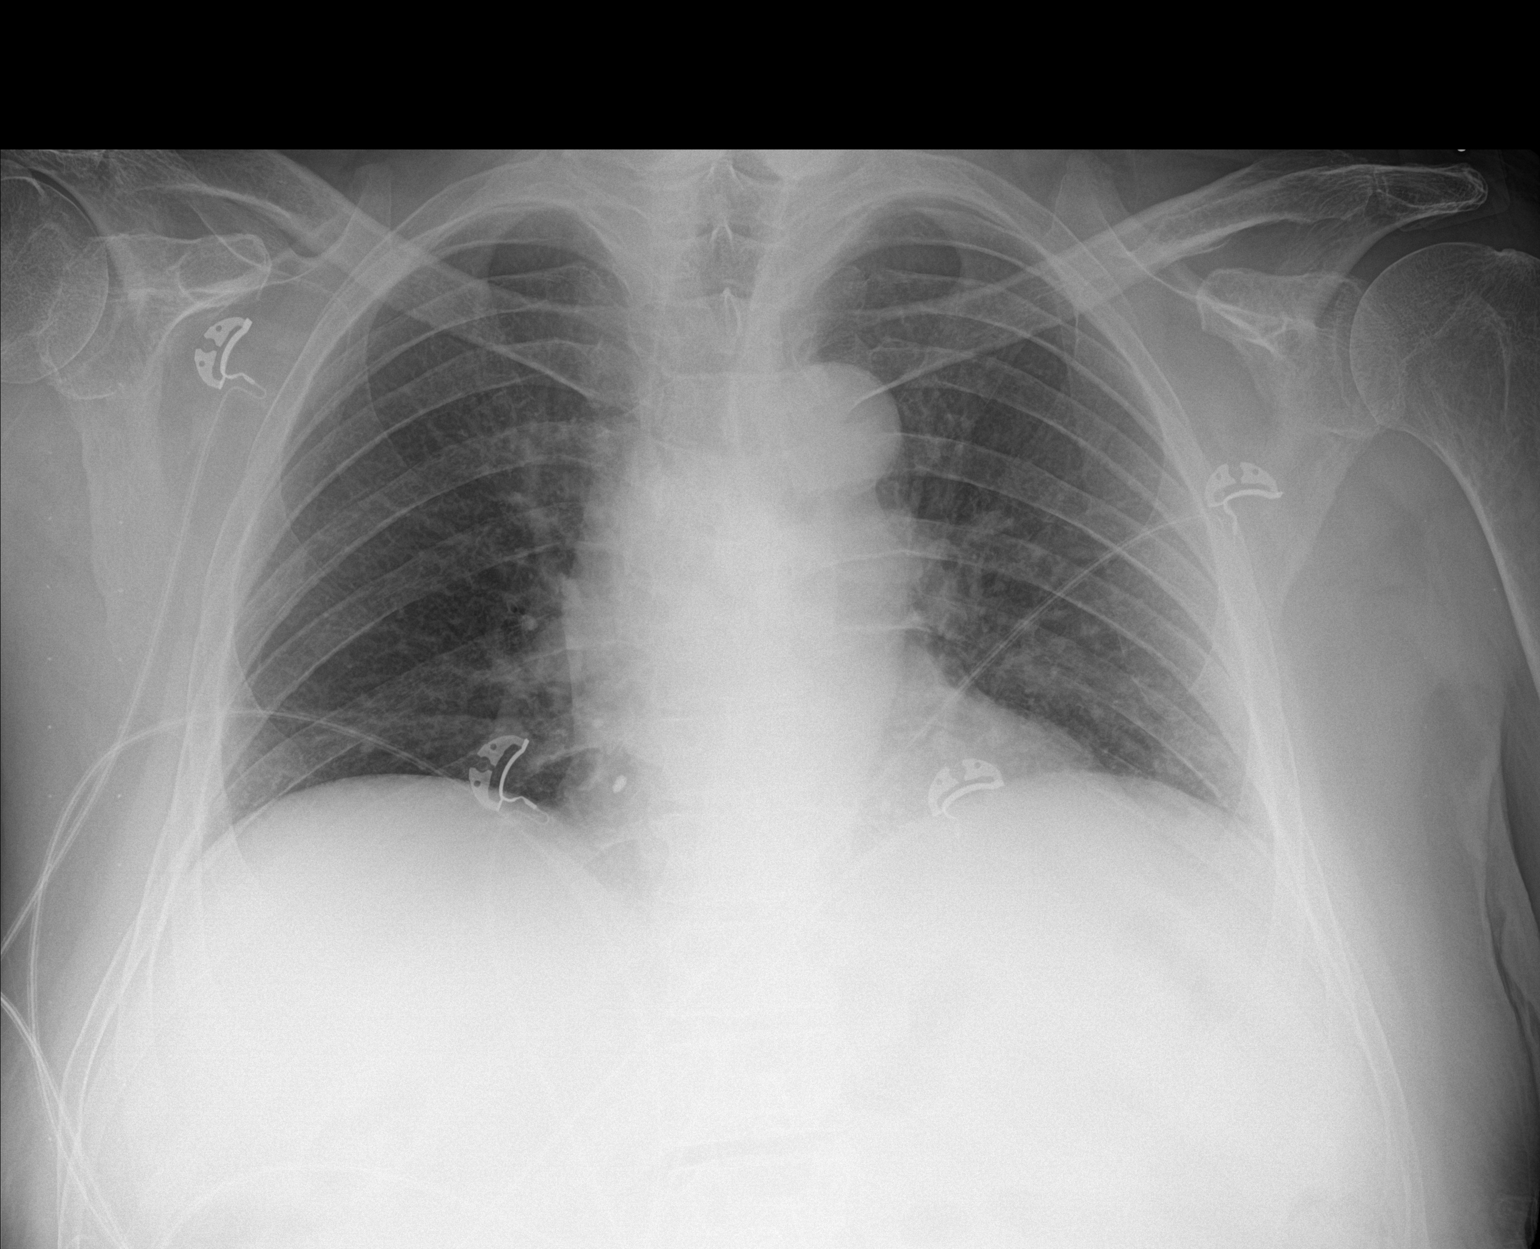

[1 of 1 positions shown; findings below may reference images not displayed]

FINDINGS: Shallow inspiration. Heart size and pulmonary vascularity are normal
for technique. No airspace disease or consolidation in the lungs. No
blunting of costophrenic angles. No pneumothorax. Mediastinal
contours appear intact.
IMPRESSION: Shallow inspiration.  No evidence of active pulmonary disease.
